# Patient Record
Sex: Male | Born: 1980 | Race: White | Hispanic: No | Marital: Married | Smoking: Never smoker
Health system: Southern US, Community
[De-identification: ages and names within clinical notes are randomized; demographics above are authoritative.]

## PROBLEM LIST (undated history)

## (undated) DIAGNOSIS — F1910 Other psychoactive substance abuse, uncomplicated: Secondary | ICD-10-CM

## (undated) HISTORY — PX: WRIST SURGERY: SHX841

---

## 2005-01-03 ENCOUNTER — Emergency Department (HOSPITAL_COMMUNITY): Admission: EM | Admit: 2005-01-03 | Discharge: 2005-01-04 | Payer: Self-pay | Admitting: Emergency Medicine

## 2014-03-24 ENCOUNTER — Inpatient Hospital Stay (HOSPITAL_COMMUNITY)
Admission: AD | Admit: 2014-03-24 | Discharge: 2014-03-28 | DRG: 885 | Disposition: A | Discharge status: Home / Self Care | Payer: BC Managed Care – PPO | Source: Intra-hospital | Attending: Psychiatry | Admitting: Psychiatry

## 2014-03-24 ENCOUNTER — Encounter (HOSPITAL_COMMUNITY): Payer: Self-pay | Admitting: Emergency Medicine

## 2014-03-24 ENCOUNTER — Emergency Department (HOSPITAL_COMMUNITY)
Admission: EM | Admit: 2014-03-24 | Discharge: 2014-03-24 | Disposition: A | Discharge status: Other Acute Inpatient Hospital | Payer: BC Managed Care – PPO | Attending: Emergency Medicine | Admitting: Emergency Medicine

## 2014-03-24 ENCOUNTER — Encounter (HOSPITAL_COMMUNITY): Payer: Self-pay

## 2014-03-24 DIAGNOSIS — F332 Major depressive disorder, recurrent severe without psychotic features: Principal | ICD-10-CM | POA: Diagnosis present

## 2014-03-24 DIAGNOSIS — R45851 Suicidal ideations: Secondary | ICD-10-CM | POA: Insufficient documentation

## 2014-03-24 DIAGNOSIS — F1020 Alcohol dependence, uncomplicated: Secondary | ICD-10-CM | POA: Diagnosis present

## 2014-03-24 DIAGNOSIS — F411 Generalized anxiety disorder: Secondary | ICD-10-CM | POA: Diagnosis present

## 2014-03-24 DIAGNOSIS — F329 Major depressive disorder, single episode, unspecified: Secondary | ICD-10-CM | POA: Diagnosis present

## 2014-03-24 DIAGNOSIS — IMO0002 Reserved for concepts with insufficient information to code with codable children: Secondary | ICD-10-CM | POA: Diagnosis not present

## 2014-03-24 DIAGNOSIS — G4700 Insomnia, unspecified: Secondary | ICD-10-CM | POA: Diagnosis present

## 2014-03-24 DIAGNOSIS — Z008 Encounter for other general examination: Secondary | ICD-10-CM | POA: Insufficient documentation

## 2014-03-24 HISTORY — DX: Other psychoactive substance abuse, uncomplicated: F19.10

## 2014-03-24 LAB — RAPID URINE DRUG SCREEN, HOSP PERFORMED
AMPHETAMINES: NOT DETECTED
BENZODIAZEPINES: NOT DETECTED
Barbiturates: NOT DETECTED
Cocaine: NOT DETECTED
OPIATES: NOT DETECTED
TETRAHYDROCANNABINOL: NOT DETECTED

## 2014-03-24 LAB — COMPREHENSIVE METABOLIC PANEL
ALT: 15 U/L (ref 0–53)
ANION GAP: 14 (ref 5–15)
AST: 28 U/L (ref 0–37)
Albumin: 4.8 g/dL (ref 3.5–5.2)
Alkaline Phosphatase: 44 U/L (ref 39–117)
BUN: 15 mg/dL (ref 6–23)
CALCIUM: 9.6 mg/dL (ref 8.4–10.5)
CO2: 28 meq/L (ref 19–32)
Chloride: 99 mEq/L (ref 96–112)
Creatinine, Ser: 0.83 mg/dL (ref 0.50–1.35)
Glucose, Bld: 104 mg/dL — ABNORMAL HIGH (ref 70–99)
POTASSIUM: 3.9 meq/L (ref 3.7–5.3)
SODIUM: 141 meq/L (ref 137–147)
TOTAL PROTEIN: 8.4 g/dL — AB (ref 6.0–8.3)
Total Bilirubin: 0.8 mg/dL (ref 0.3–1.2)

## 2014-03-24 LAB — CBC
HEMATOCRIT: 45.1 % (ref 39.0–52.0)
Hemoglobin: 15.1 g/dL (ref 13.0–17.0)
MCH: 32.9 pg (ref 26.0–34.0)
MCHC: 33.5 g/dL (ref 30.0–36.0)
MCV: 98.3 fL (ref 78.0–100.0)
Platelets: 223 10*3/uL (ref 150–400)
RBC: 4.59 MIL/uL (ref 4.22–5.81)
RDW: 13.8 % (ref 11.5–15.5)
WBC: 6 10*3/uL (ref 4.0–10.5)

## 2014-03-24 LAB — ACETAMINOPHEN LEVEL: Acetaminophen (Tylenol), Serum: <15 ug/mL (ref 10–30)

## 2014-03-24 LAB — ETHANOL

## 2014-03-24 LAB — SALICYLATE LEVEL

## 2014-03-24 MED ORDER — SERTRALINE HCL 50 MG PO TABS
25.0000 mg | ORAL_TABLET | Freq: Every day | ORAL | Status: DC
  Administered 2014-03-24: 25 mg via ORAL
  Filled 2014-03-24: qty 1

## 2014-03-24 MED ORDER — ALUM & MAG HYDROXIDE-SIMETH 200-200-20 MG/5ML PO SUSP: 30.0000 mL | ORAL | Status: DC | PRN

## 2014-03-24 MED ORDER — MAGNESIUM HYDROXIDE 400 MG/5ML PO SUSP: 30.0000 mL | Freq: Every day | ORAL | Status: DC | PRN

## 2014-03-24 MED ORDER — HYDROXYZINE HCL 25 MG PO TABS: 25.0000 mg | ORAL_TABLET | Freq: Four times a day (QID) | ORAL | Status: DC | PRN

## 2014-03-24 MED ORDER — ACETAMINOPHEN 325 MG PO TABS: 650.0000 mg | ORAL_TABLET | Freq: Four times a day (QID) | ORAL | Status: DC | PRN

## 2014-03-24 MED ORDER — HYDROXYZINE HCL 25 MG PO TABS
50.0000 mg | ORAL_TABLET | Freq: Every evening | ORAL | Status: DC | PRN
Start: 2014-03-24 — End: 2014-03-24

## 2014-03-24 MED ORDER — ONDANSETRON HCL 4 MG PO TABS: 4.0000 mg | ORAL_TABLET | Freq: Three times a day (TID) | ORAL | Status: DC | PRN

## 2014-03-24 MED ORDER — TRAZODONE HCL 50 MG PO TABS
50.0000 mg | ORAL_TABLET | Freq: Every evening | ORAL | Status: DC | PRN
  Filled 2014-03-24 (×2): qty 1
  Filled 2014-03-24: qty 8
  Filled 2014-03-24 (×3): qty 1
  Filled 2014-03-24 (×2): qty 8
  Filled 2014-03-24 (×3): qty 1
  Filled 2014-03-24 (×2): qty 8
  Filled 2014-03-24 (×2): qty 1

## 2014-03-24 MED ORDER — LORAZEPAM 1 MG PO TABS: 1.0000 mg | ORAL_TABLET | Freq: Three times a day (TID) | ORAL | Status: DC | PRN

## 2014-03-24 NOTE — Progress Notes (Signed)
Per Minerva Areola [facility] patient is accepted to [facility] bed 508-1 after 7:30pm.     Maryelizabeth Rowan, MSW, [place], 03/24/2014 Evening Clinical Social Worker [phone]

## 2014-03-24 NOTE — ED Notes (Signed)
Pt. To [facility] from ED ambulatory without difficulty. Report from [employer]. Pt. Is alert and oriented, warm and dry in no distress. Pt. Denies HI, and AVH. Pt. Calm and cooperative. Pt. Encouraged to let Nursing staff know if he has concerns or needs.

## 2014-03-24 NOTE — Progress Notes (Signed)
Admission note: Patient admitted from [facility]. Patient recently adopted a 43 old baby boy from [place] and the patient is depressed because he does not feel like he is bonding with the baby and he has only been sleeping four to five hours a night. The patient is having suicidal thoughts but no plan at present and does contract for safety. Patient denies HI, AVH and substance abuse. The patient does admit to drinking 2-4 beers daily and takes Naltrexone. The patient implied that taking the Naltrexone was part of some sort of method to avoid drinking too much. Patient is pleasant and cooperative. Mood is depressed. Affect is sad. Will continue to monitor for safety. [name], RN

## 2014-03-24 NOTE — ED Provider Notes (Signed)
CSN: [id number]     Arrival date & time 03/24/14  1132 History  This chart was scribed for a non-physician practitioner, Roxy Horseman, PA-C, working with Linwood Dibbles, MD by Julian Hy, ED Scribe. The patient was seen in WTR4/WLPT4. The patient's care was started at 12:27 PM.    Chief Complaint  Patient presents with  . Suicidal  . Medical Clearance   The history is provided by the patient. No language interpreter was used.   HPI Comments: Vincent Townsend is a 33 y.o. male who presents to the Emergency Department complaining of recurrent, intermittent, gradually worsening suicidal ideation onset three days ago. Pt reports he has had issues with SI for the past 3 years. Pt reports he had a plan 1) borrow a gun from his father or 2) sleeping pills and whiskey. Pt denies ever acting on his ideas. Pt denies HI. Pt reports he drank last night. Pt denies illicit drug usage. Pt denies chest pain, nausea, vomiting, SOB, abdominal pain and headache.  Past Medical History  Diagnosis Date  . Substance abuse    Past Surgical History  Procedure Laterality Date  . Wrist surgery     No family history on file. History  Substance Use Topics  . Smoking status: Never Smoker   . Smokeless tobacco: Not on file  . Alcohol Use: No    Review of Systems  Constitutional: Negative for fever.  Cardiovascular: Negative for chest pain.  Gastrointestinal: Negative for nausea, vomiting and abdominal pain.  Musculoskeletal: Negative for arthralgias and back pain.  Neurological: Negative for headaches.  Psychiatric/Behavioral: Positive for suicidal ideas. Negative for self-injury.   Allergies  Review of patient's allergies indicates no known allergies.  Home Medications   Prior to Admission medications   Medication Sig Start Date End Date Taking? Authorizing Provider  Melatonin 5 MG TABS Take 1 tablet by mouth at bedtime.   Yes Historical Provider, MD  naltrexone (DEPADE) 50 MG tablet Take 50 mg by mouth  daily as needed (for drinking.).   Yes Historical Provider, MD   Triage Vitals: BP 141/82  Pulse 67  Temp(Src) 98.5 F (36.9 C) (Oral)  Resp 16  SpO2 99% Physical Exam  Nursing note and vitals reviewed. Constitutional: He is oriented to person, place, and time. He appears well-developed and well-nourished. No distress.  HENT:  Head: Normocephalic and atraumatic.  Eyes: Conjunctivae and EOM are normal. Pupils are equal, round, and reactive to light. Right eye exhibits no discharge. Left eye exhibits no discharge. No scleral icterus.  Neck: Normal range of motion. Neck supple. No JVD present. No tracheal deviation present.  Cardiovascular: Normal rate, regular rhythm and normal heart sounds.  Exam reveals no gallop and no friction rub.   No murmur heard. Pulmonary/Chest: Effort normal and breath sounds normal. No respiratory distress. He has no wheezes. He has no rales. He exhibits no tenderness.  Abdominal: Soft. Bowel sounds are normal. He exhibits no distension and no mass. There is no tenderness. There is no rebound and no guarding.  Musculoskeletal: Normal range of motion. He exhibits no edema and no tenderness.  Neurological: He is alert and oriented to person, place, and time.  Skin: Skin is warm and dry.  Psychiatric: He has a normal mood and affect. His behavior is normal. Judgment and thought content normal.    ED Course  Procedures (including critical care time) DIAGNOSTIC STUDIES: Oxygen Saturation is 99% on RA, normal by my interpretation.    COORDINATION OF CARE: 12:31 PM-  Patient informed of current plan for treatment and evaluation and agrees with plan at this time.  Results for orders placed during the hospital encounter of 03/24/14  ACETAMINOPHEN LEVEL      Result Value Ref Range   Acetaminophen (Tylenol), Serum <15.0  10 - 30 ug/mL  CBC      Result Value Ref Range   WBC 6.0  4.0 - 10.5 K/uL   RBC 4.59  4.22 - 5.81 MIL/uL   Hemoglobin 15.1  13.0 - 17.0 g/dL    HCT 46.9  62.9 - 52.8 %   MCV 98.3  78.0 - 100.0 fL   MCH 32.9  26.0 - 34.0 pg   MCHC 33.5  30.0 - 36.0 g/dL   RDW 41.3  24.4 - 01.0 %   Platelets 223  150 - 400 K/uL  COMPREHENSIVE METABOLIC PANEL      Result Value Ref Range   Sodium 141  137 - 147 mEq/L   Potassium 3.9  3.7 - 5.3 mEq/L   Chloride 99  96 - 112 mEq/L   CO2 28  19 - 32 mEq/L   Glucose, Bld 104 (*) 70 - 99 mg/dL   BUN 15  6 - 23 mg/dL   Creatinine, Ser 2.72  0.50 - 1.35 mg/dL   Calcium 9.6  8.4 - 53.6 mg/dL   Total Protein 8.4 (*) 6.0 - 8.3 g/dL   Albumin 4.8  3.5 - 5.2 g/dL   AST 28  0 - 37 U/L   ALT 15  0 - 53 U/L   Alkaline Phosphatase 44  39 - 117 U/L   Total Bilirubin 0.8  0.3 - 1.2 mg/dL   GFR calc non Af Amer >90  >90 mL/min   GFR calc Af Amer >90  >90 mL/min   Anion gap 14  5 - 15  ETHANOL      Result Value Ref Range   Alcohol, Ethyl (B) <11  0 - 11 mg/dL  SALICYLATE LEVEL      Result Value Ref Range   Salicylate Lvl <2.0 (*) 2.8 - 20.0 mg/dL  URINE RAPID DRUG SCREEN (HOSP PERFORMED)      Result Value Ref Range   Opiates NONE DETECTED  NONE DETECTED   Cocaine NONE DETECTED  NONE DETECTED   Benzodiazepines NONE DETECTED  NONE DETECTED   Amphetamines NONE DETECTED  NONE DETECTED   Tetrahydrocannabinol NONE DETECTED  NONE DETECTED   Barbiturates NONE DETECTED  NONE DETECTED   No results found.   MDM   Final diagnoses:  Major depressive disorder, recurrent, severe without psychotic features    Patient with SI.  Wishes to shoot himself or overdose.  TTS consult pending.  I personally performed the services described in this documentation, which was scribed in my presence. The recorded information has been reviewed and is accurate.     Roxy Horseman, PA-C 03/24/14 1829

## 2014-03-24 NOTE — ED Notes (Signed)
Pt transported to [facility] by [facility] transportation service for continuation of specialized care. He left in no acute distress. 

## 2014-03-24 NOTE — Consult Note (Signed)
[facility] Face-to-Face Psychiatry Consult   Reason for Consult:  Depression with suicidal ideations Referring Physician:  EDP  Vincent Townsend is an 33 y.o. male. Total Time spent with patient: 20 minutes  Assessment: AXIS I:  Major Depression, Recurrent severe AXIS II:  Deferred AXIS III:   Past Medical History  Diagnosis Date  . Substance abuse    AXIS IV:  other psychosocial or environmental problems, problems related to social environment and problems with primary support group AXIS V:  21-30 behavior considerably influenced by delusions or hallucinations OR serious impairment in judgment, communication OR inability to function in almost all areas  Plan:  Recommend psychiatric Inpatient admission when medically cleared.   Subjective:   Vincent Townsend is a 33 y.o. male patient admitted with   HPI:  On admission:  Vincent Townsend is a 33 y.o. male who presents to the Emergency Department complaining of recurrent, intermittent, gradually worsening suicidal ideation onset three days ago. Pt reports he has had issues with SI for the past 3 years. Pt reports he had a plan 1) borrow a gun from his father or 2) sleeping pills and whiskey. Pt denies ever acting on his ideas. Pt denies HI. Pt reports he drank last night. Pt denies illicit drug usage. Pt denies chest pain, nausea, vomiting, SOB, abdominal pain and headache. On assessment:  Patient is tearful with a supportive, tearful wife at his bedside.  They recently went to [place] for 38 days to get their adopted baby boy.  He is now 2 months old and active but no apparent special needs.   Patient does not feel he has bonded with the baby.  Both parents have had lack of sleep and appear to be tired and exhausted.  [name] has been getting 4-5 hours of sleep with a decrease in appetite.  He has been depressed before but never on medications; saw a counselor Dr. Bubba Hales in the past and would like him involved in his care.  Takes Naltrexone preventively due to  alcoholism in his family, never been in detox or rehab for alcohol abuse, Vernona Rieger method to stay clear of alcohol abuse.  Denies homicidal ideations, hallucinations, and drug abuse.  He is unable to contract for safety and needs stabilization.  HPI Elements:   Location:  generalized. Quality:  acute. Severity:  severe. Timing:  constant. Duration:  3 days. Context:  stressors.  Past Psychiatric History: Past Medical History  Diagnosis Date  . Substance abuse     reports that he has never smoked. He does not have any smokeless tobacco history on file. He reports that he does not drink alcohol or use illicit drugs. No family history on file.         Allergies:  No Known Allergies  ACT Assessment Complete:  Yes:    Educational Status    Risk to Self: Risk to self with the past 6 months Is patient at risk for suicide?: Yes Substance abuse history and/or treatment for substance abuse?: No  Risk to Others:    Abuse:    Prior Inpatient Therapy:    Prior Outpatient Therapy:    Additional Information:                    Objective: Blood pressure 141/82, pulse 67, temperature 98.5 F (36.9 C), temperature source Oral, resp. rate 16, SpO2 99.00%.There is no height or weight on file to calculate BMI. Results for orders placed during the hospital encounter of 03/24/14 (from the past  72 hour(s))  ACETAMINOPHEN LEVEL     Status: None   Collection Time    03/24/14 11:55 AM      Result Value Ref Range   Acetaminophen (Tylenol), Serum <15.0  10 - 30 ug/mL   Comment:            THERAPEUTIC CONCENTRATIONS VARY     SIGNIFICANTLY. A RANGE OF 10-30     ug/mL MAY BE AN EFFECTIVE     CONCENTRATION FOR MANY PATIENTS.     HOWEVER, SOME ARE BEST TREATED     AT CONCENTRATIONS OUTSIDE THIS     RANGE.     ACETAMINOPHEN CONCENTRATIONS     >150 ug/mL AT 4 HOURS AFTER     INGESTION AND >50 ug/mL AT 12     HOURS AFTER INGESTION ARE     OFTEN ASSOCIATED WITH TOXIC     REACTIONS.   CBC     Status: None   Collection Time    03/24/14 11:55 AM      Result Value Ref Range   WBC 6.0  4.0 - 10.5 K/uL   RBC 4.59  4.22 - 5.81 MIL/uL   Hemoglobin 15.1  13.0 - 17.0 g/dL   HCT 45.1  39.0 - 52.0 %   MCV 98.3  78.0 - 100.0 fL   MCH 32.9  26.0 - 34.0 pg   MCHC 33.5  30.0 - 36.0 g/dL   RDW 13.8  11.5 - 15.5 %   Platelets 223  150 - 400 K/uL  COMPREHENSIVE METABOLIC PANEL     Status: Abnormal   Collection Time    03/24/14 11:55 AM      Result Value Ref Range   Sodium 141  137 - 147 mEq/L   Potassium 3.9  3.7 - 5.3 mEq/L   Chloride 99  96 - 112 mEq/L   CO2 28  19 - 32 mEq/L   Glucose, Bld 104 (*) 70 - 99 mg/dL   BUN 15  6 - 23 mg/dL   Creatinine, Ser 0.83  0.50 - 1.35 mg/dL   Calcium 9.6  8.4 - 10.5 mg/dL   Total Protein 8.4 (*) 6.0 - 8.3 g/dL   Albumin 4.8  3.5 - 5.2 g/dL   AST 28  0 - 37 U/L   ALT 15  0 - 53 U/L   Alkaline Phosphatase 44  39 - 117 U/L   Total Bilirubin 0.8  0.3 - 1.2 mg/dL   GFR calc non Af Amer >90  >90 mL/min   GFR calc Af Amer >90  >90 mL/min   Comment: (NOTE)     The eGFR has been calculated using the CKD EPI equation.     This calculation has not been validated in all clinical situations.     eGFR's persistently <90 mL/min signify possible Chronic Kidney     Disease.   Anion gap 14  5 - 15  ETHANOL     Status: None   Collection Time    03/24/14 11:55 AM      Result Value Ref Range   Alcohol, Ethyl (B) <11  0 - 11 mg/dL   Comment:            LOWEST DETECTABLE LIMIT FOR     SERUM ALCOHOL IS 11 mg/dL     FOR MEDICAL PURPOSES ONLY  SALICYLATE LEVEL     Status: Abnormal   Collection Time    03/24/14 11:55 AM      Result Value  Ref Range   Salicylate Lvl <0.9 (*) 2.8 - 20.0 mg/dL  URINE RAPID DRUG SCREEN (HOSP PERFORMED)     Status: None   Collection Time    03/24/14 11:57 AM      Result Value Ref Range   Opiates NONE DETECTED  NONE DETECTED   Cocaine NONE DETECTED  NONE DETECTED   Benzodiazepines NONE DETECTED  NONE DETECTED    Amphetamines NONE DETECTED  NONE DETECTED   Tetrahydrocannabinol NONE DETECTED  NONE DETECTED   Barbiturates NONE DETECTED  NONE DETECTED   Comment:            DRUG SCREEN FOR MEDICAL PURPOSES     ONLY.  IF CONFIRMATION IS NEEDED     FOR ANY PURPOSE, NOTIFY LAB     WITHIN 5 DAYS.                LOWEST DETECTABLE LIMITS     FOR URINE DRUG SCREEN     Drug Class       Cutoff (ng/mL)     Amphetamine      1000     Barbiturate      200     Benzodiazepine   323     Tricyclics       557     Opiates          300     Cocaine          300     THC              50   Labs are reviewed and are pertinent for no medical issues noted.  Current Facility-Administered Medications  Medication Dose Route Frequency Provider Last Rate Last Dose  . LORazepam (ATIVAN) tablet 1 mg  1 mg Oral Q8H PRN Montine Circle, PA-C      . ondansetron [facility]) tablet 4 mg  4 mg Oral Q8H PRN Montine Circle, PA-C       Current Outpatient Prescriptions  Medication Sig Dispense Refill  . Melatonin 5 MG TABS Take 1 tablet by mouth at bedtime.      . naltrexone (DEPADE) 50 MG tablet Take 50 mg by mouth daily as needed (for drinking.).        Psychiatric Specialty Exam:     Blood pressure 141/82, pulse 67, temperature 98.5 F (36.9 C), temperature source Oral, resp. rate 16, SpO2 99.00%.There is no height or weight on file to calculate BMI.  General Appearance: Casual  Eye Contact::  Fair  Speech:  Normal Rate  Volume:  Normal  Mood:  Depressed  Affect:  Depressed  Thought Process:  Coherent  Orientation:  Full (Time, Place, and Person)  Thought Content:  WDL  Suicidal Thoughts:  Yes.  with intent/plan  Homicidal Thoughts:  No  Memory:  Immediate;   Fair Recent;   Fair Remote;   Fair  Judgement:  Poor  Insight:  Fair  Psychomotor Activity:  Decreased  Concentration:  Fair  Recall:  [employer] of Knowledge:Good  Language: Good  Akathisia:  No  Handed:  Right  AIMS (if indicated):     Assets:   Communication Skills Desire for Improvement Financial Resources/Insurance Housing Intimacy Leisure Time [place] Talents/Skills Transportation Vocational/Educational  Sleep:      Musculoskeletal: Strength & Muscle Tone: within normal limits Gait & Station: normal Patient leans: N/A  Treatment Plan Summary: Daily contact with patient to assess and evaluate symptoms and progress in treatment Medication  management; Zoloft started for depression, admit to inpatient psychiatry for stabilization  Waylan Boga, PMH-NP 03/24/2014 2:14 PM  I agreed with the findings, treatment and disposition plan of this patient. Berniece Andreas, MD

## 2014-03-24 NOTE — Tx Team (Signed)
Initial Interdisciplinary Treatment Plan   PATIENT STRESSORS: recently adopted a 26 old baby from [place]   PROBLEM LIST: Problem List/Patient Goals Date to be addressed Date deferred Reason deferred Estimated date of resolution  depression 03/24/14     Suicidal ideation 03/24/14                                                DISCHARGE CRITERIA:  Ability to meet basic life and health needs Adequate post-discharge living arrangements Improved stabilization in mood, thinking, and/or behavior Medical problems require only outpatient monitoring Motivation to continue treatment in a less acute level of care Need for constant or close observation no longer present Reduction of life-threatening or endangering symptoms to within safe limits Safe-care adequate arrangements made Verbal commitment to aftercare and medication compliance  PRELIMINARY DISCHARGE PLAN: Outpatient therapy Return to previous living arrangement Return to previous work or school arrangements  PATIENT/FAMIILY INVOLVEMENT: This treatment plan has been presented to and reviewed with the patient, Vincent Townsend, and/or family member.  The patient and family have been given the opportunity to ask questions and make suggestions.  [name] 03/24/2014, 10:43 PM

## 2014-03-24 NOTE — ED Notes (Signed)
Patient has been wanded by security.  Patient has on burgundy scrubs and yellow socks.  Patient has one personal belonging bag.

## 2014-03-24 NOTE — ED Notes (Signed)
Britta Mccreedy, RN at [facility] reported KIM, [facility] stated to hold patient for 1.5 hours.

## 2014-03-24 NOTE — ED Notes (Signed)
Pts. Therapist is Homero Fellers in [place].

## 2014-03-24 NOTE — ED Notes (Addendum)
Pt c/o SI w/ plan. States he has couple of options, gun from dad or pills and liquor. No attempts in past.

## 2014-03-25 DIAGNOSIS — F339 Major depressive disorder, recurrent, unspecified: Secondary | ICD-10-CM

## 2014-03-25 DIAGNOSIS — R45851 Suicidal ideations: Secondary | ICD-10-CM

## 2014-03-25 MED ORDER — NALTREXONE HCL 50 MG PO TABS
50.0000 mg | ORAL_TABLET | Freq: Every day | ORAL | Status: DC | PRN
  Filled 2014-03-25: qty 4

## 2014-03-25 MED ORDER — MELATONIN 5 MG PO TABS
1.0000 | ORAL_TABLET | Freq: Every day | ORAL | Status: DC
  Administered 2014-03-25 – 2014-03-27 (×3): 5 mg via ORAL

## 2014-03-25 MED ORDER — BUPROPION HCL ER (XL) 150 MG PO TB24
150.0000 mg | ORAL_TABLET | Freq: Every day | ORAL | Status: DC
  Administered 2014-03-25 – 2014-03-27 (×3): 150 mg via ORAL
  Filled 2014-03-25 (×6): qty 1

## 2014-03-25 NOTE — Progress Notes (Signed)
Patient ID: Vincent Townsend, male   DOB: 01-17-1981, 33 y.o.   MRN: [id number] He has been up and two groups interacting with peers and staff. Self inventory this AM: depression 3, hopelessness 1, anxiety 0, denies SI thoughts.His goal is to work on his discharge plans  And to talk to his wife. Wellbutrin started today.

## 2014-03-25 NOTE — [facility] Suicide Risk Assessment (Signed)
   Nursing information obtained from:    Demographic factors:   33 year old man, married , employed Current Mental Status:   See below Loss Factors:   depression, difficulty relating to recent adoption of child Historical Factors:   History of Depression Risk Reduction Factors:   supportive family, employed Total Time spent with patient: 45 minutes  CLINICAL FACTORS:   Depression:   Comorbid alcohol abuse/dependence  Psychiatric Specialty Exam: Physical Exam  ROS  Blood pressure 128/68, pulse 64, temperature 98.6 F (37 C), temperature source Oral, resp. rate 18, height  (1.727 m), weight 67.132 kg (148 lb).Body mass index is 22.51 kg/(m^2).  General Appearance: Well Groomed  Patent attorney::  Good  Speech:  Normal Rate  Volume:  Normal  Mood:  Depressed  Affect:  Constricted and but reactive  Thought Process:  Intact and Linear  Orientation:  Other:  fully alert and attentive  Thought Content:  no hallucinations, no delusions  Suicidal Thoughts:  No- at this time he denies any thoughts of hurting self or anyone else- he contracts for safety on the unit  Homicidal Thoughts:  No  Memory:  NA  Judgement:  Fair  Insight:  Fair  Psychomotor Activity:  Normal  Concentration:  Good  Recall:  Good  Fund of Knowledge:Good  Language: Good  Akathisia:  Negative  Handed:  Right  AIMS (if indicated):     Assets:  Communication Skills Desire for Improvement Resilience Social Support Talents/Skills  Sleep:  Number of Hours: 5.75   Musculoskeletal: Strength & Muscle Tone: within normal limits Gait & Station: normal Patient leans: N/A  COGNITIVE FEATURES THAT CONTRIBUTE TO RISK:  Polarized thinking    SUICIDE RISK:   Moderate:  Frequent suicidal ideation with limited intensity, and duration, some specificity in terms of plans, no associated intent, good self-control, limited dysphoria/symptomatology, some risk factors present, and identifiable protective factors, including  available and accessible social support.  PLAN OF CARE: Patient will be admitted to inpatient psychiatric unit for stabilization and safety. Will provide and encourage milieu participation. Provide medication management and maked adjustments as needed.  Will follow daily.    I certify that inpatient services furnished can reasonably be expected to improve the patient's condition.  Waymond Meador 03/25/2014, 2:06 PM

## 2014-03-25 NOTE — [facility] Suicide Risk Assessment (Signed)
[facility] INPATIENT:  Family/Significant Other Suicide Prevention Education  Suicide Prevention Education:  Education Completed; Wife Goldie Dimmer [phone],  (name of family member/significant other) has been identified by the patient as the family member/significant other with whom the patient will be residing, and identified as the person(s) who will aid the patient in the event of a mental health crisis (suicidal ideations/suicide attempt).  With written consent from the patient, the family member/significant other has been provided the following suicide prevention education, prior to the and/or following the discharge of the patient.  The suicide prevention education provided includes the following:  Suicide risk factors  Suicide prevention and interventions  National Suicide Hotline telephone number  [facility] assessment telephone number  [facility] Emergency Assistance 911  [facility] and/or Residential Mobile Crisis Unit telephone number  Request made of family/significant other to:  Remove weapons (e.g., guns, rifles, knives), all items previously/currently identified as safety concern.    Remove drugs/medications (over-the-counter, prescriptions, illicit drugs), all items previously/currently identified as a safety concern.  The family member/significant other verbalizes understanding of the suicide prevention education information provided.  The family member/significant other agrees to remove the items of safety concern listed above.  Vincent Townsend, [place] 03/25/2014, 12:41 PM

## 2014-03-25 NOTE — Progress Notes (Signed)
The focus of this group is to educate the patient on the purpose and policies of crisis stabilization and provide a format to answer questions about their admission.  The group details unit policies and expectations of patients while admitted.  Patient did not attend 0900 nurse education orientation group this morning.  Patient was in conference with MD.  

## 2014-03-25 NOTE — [facility] Group Notes (Signed)
[facility] LCSW Group Therapy  03/25/2014   1:15 PM   Type of Therapy:  Group Therapy  Participation Level:  Active  Participation Quality:  Attentive, Sharing and Supportive  Affect:  Depressed and Flat  Cognitive:  Alert and Oriented  Insight:  Developing/Improving and Engaged  Engagement in Therapy:  Developing/Improving and Engaged  Modes of Intervention:  Clarification, Confrontation, Discussion, Education, Exploration, Limit-setting, Orientation, Problem-solving, Rapport Building, Dance movement psychotherapist, Socialization and Support  Summary of Progress/Problems: The topic for group therapy was feelings about diagnosis.  Pt actively participated in group discussion on their past and current diagnosis and how they feel towards this.  Pt also identified how society and family members judge them, based on their diagnosis as well as stereotypes and stigmas.  Patient reports that he is experiencing depression and experiences shame regarding people knowing that he experienced SI, specifically his extended family. CSW and other group members provided emotional support and encouragement.   Samuella Bruin, MSW, [employer] Clinical Social Worker [facility] [phone]

## 2014-03-25 NOTE — Clinical Social Work Note (Signed)
CSW left voicemail for patient's therapist Curlene Dolphin [phone] to schedule upcoming appointment. Awaiting call back.   Samuella Bruin, MSW, [employer] Clinical Social Worker [facility] [phone]

## 2014-03-25 NOTE — Progress Notes (Signed)
Recreation Therapy Notes  Animal-Assisted Activity/Therapy (AAA/T) Program Checklist/Progress Notes Patient Eligibility Criteria Checklist & Daily Group note for Rec Tx Intervention  Date: 08.25.2015 Time: 2:45pm Location: [address]   AAA/T Program Assumption of Risk Form signed by Patient/ or Parent Legal Guardian yes  Patient is free of allergies or sever asthma yes  Patient reports no fear of animals yes  Patient reports no history of cruelty to animals yes   Patient understands his/her participation is voluntary yes  Patient washes hands before animal contact yes  Patient washes hands after animal contact yes  Behavioral Response: Appropriate   Education: Hand Washing, Appropriate Animal Interaction   Education Outcome: Acknowledges understanding   Clinical Observations/Feedback: Patient attended and participated appropriately with therapeutic dog team.   Jearl Klinefelter, LRT/CTRS  Delanie Tirrell L 03/25/2014 4:09 PM
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
## 2014-03-25 NOTE — Progress Notes (Signed)
Adult Psychoeducational Group Note  Date:  03/25/2014 Time:  10:17 PM  Group Topic/Focus:  Wrap-Up Group:   The focus of this group is to help patients review their daily goal of treatment and discuss progress on daily workbooks.  Participation Level:  Active  Participation Quality:  Appropriate  Affect:  Appropriate  Cognitive:  Appropriate  Insight: Appropriate  Engagement in Group:  Engaged  Modes of Intervention:  Activity  Additional Comments:  Pt was in group and participated in group while group was going on..the patient talk to other with respect   Marlette Curvin R 03/25/2014, 10:17 PM

## 2014-03-25 NOTE — Tx Team (Signed)
Initial Interdisciplinary Treatment Plan   PATIENT STRESSORS: Health problems Substance abuse   PROBLEM LIST: Problem List/Patient Goals Date to be addressed Date deferred Reason deferred Estimated date of resolution  'substance abuse"      ' insomnia "                                                 DISCHARGE CRITERIA:  Ability to meet basic life and health needs Improved stabilization in mood, thinking, and/or behavior Safe-care adequate arrangements made Verbal commitment to aftercare and medication compliance Withdrawal symptoms are absent or subacute and managed without 24-hour nursing intervention  PRELIMINARY DISCHARGE PLAN: Attend 12-step recovery group Return to previous living arrangement Return to previous work or school arrangements  PATIENT/FAMIILY INVOLVEMENT: This treatment plan has been presented to and reviewed with the patient, Vincent Townsend.  Patient is assertive and cooperative and plans to work on discharge plan and after care . The patient and family have been given the opportunity to ask questions and make suggestions.    Oliva Bustard 03/25/2014, 4:00 PM

## 2014-03-26 DIAGNOSIS — F329 Major depressive disorder, single episode, unspecified: Secondary | ICD-10-CM

## 2014-03-26 NOTE — ED Provider Notes (Signed)
Medical screening examination/treatment/procedure(s) were performed by non-physician practitioner and as supervising physician I was immediately available for consultation/collaboration.    Nicholaos Schippers, MD 03/26/14 0724 

## 2014-03-26 NOTE — Progress Notes (Signed)
D   Pt is pleasant on approach and appropriate in his interactions with others   He appears to have some anxiety and interacts minimally with others    He stayed in his room writing in his journal   He took his melantonin but did not want the trazadone   He reports no problems with the new antidepressant Wellbutrin that he started today   He reports some improvement in mood related to getting some rest and said he is looking forward to being discharged A   Pt was encouraged to take medications after eating and not on an empty stomach   Verbal support given   Medications administered and effectiveness monitored   Q 15 min checks R   Pt safe at present

## 2014-03-26 NOTE — Progress Notes (Signed)
Adult Psychoeducational Group Note  Date:  03/26/2014 Time:  10:54 PM  Group Topic/Focus:  Goals Group:   The focus of this group is to help patients establish daily goals to achieve during treatment and discuss how the patient can incorporate goal setting into their daily lives to aide in recovery.  Participation Level:  Active  Participation Quality:  Appropriate  Affect:  Appropriate  Cognitive:  Appropriate  Insight: Appropriate  Engagement in Group:  Engaged  Modes of Intervention:  Discussion  Additional Comments:  Pt stated that the visit from his wife today made his day so much better.   Aldona Lento 03/26/2014, 10:54 PM

## 2014-03-26 NOTE — Progress Notes (Signed)
Patient ID: Vincent Townsend, male   DOB: [birth year], 33 y.o.   MRN: [id number] He has been up and to a couple of groups has little interaction with peers and staff. Has been in room reading . He has had no c/o discomfort. Self inventory this AM:  All the question were marked 0's.

## 2014-03-26 NOTE — [facility] Group Notes (Signed)
[facility] LCSW Group Therapy 03/26/2014  1:15 PM Type of Therapy: Group Therapy Participation Level: Active  Participation Quality: Attentive, Sharing and Supportive  Affect: Depressed and Flat  Cognitive: Alert and Oriented  Insight: Developing/Improving and Engaged  Engagement in Therapy: Developing/Improving and Engaged  Modes of Intervention: Clarification, Confrontation, Discussion, Education, Exploration, Limit-setting, Orientation, Problem-solving, Rapport Building, Dance movement psychotherapist, Socialization and Support  Summary of Progress/Problems: The topic for group today was emotional regulation. This group focused on both positive and negative emotion identification and allowed group members to process ways to identify feelings, regulate negative emotions, and find healthy ways to manage internal/external emotions. Group members were asked to reflect on a time when their reaction to an emotion led to a negative outcome and explored how alternative responses using emotion regulation would have benefited them. Group members were also asked to discuss a time when emotion regulation was utilized when a negative emotion was experienced. Patient identified experiencing hopelessness related to the adjustment of adopting his son. He reports that he tries to remind his self that the situation is only temporary. Patient reports that he would like to have more relaxation time for himself as well as a couple with his wife. CSW's and other group members provided patient with emotional support and encouragement.  Samuella Bruin, MSW, [employer] Clinical Social Worker [facility] [phone]

## 2014-03-26 NOTE — Progress Notes (Signed)
Adult Psychoeducational Group Note  Date:  03/26/2014 Time:  11:12 AM  Group Topic/Focus:  Personal Choices and Values:   The focus of this group is to help patients assess and explore the importance of values in their lives, how their values affect their decisions, how they express their values and what opposes their expression.  Participation Level:  Active  Participation Quality:  Appropriate, Attentive and Sharing  Affect:  Appropriate  Cognitive:  Alert, Appropriate and Oriented  Insight: Appropriate and Good  Engagement in Group:  Engaged  Modes of Intervention:  Discussion, Education, Socialization and Support  Additional Comments:  Pt attended and participated in group.  Pt stated that personal development means "learning to find ways to deal with my depression."  Pt also stated "my current situation is not forever"  Berlin Hun 03/26/2014, 11:12 AM

## 2014-03-26 NOTE — [facility] Group Notes (Signed)
   [facility] LCSW Aftercare Discharge Planning Group Note  03/26/2014  8:45 AM   Participation Quality: Alert, Appropriate and Oriented  Mood/Affect: Appropriate  Depression Rating: 0  Anxiety Rating: 0  Thoughts of Suicide: Pt denies SI/HI  Will you contract for safety? Yes  Current AVH: Pt denies  Plan for Discharge/Comments: Pt attended discharge planning group and actively participated in group. CSW provided pt with today's workbook. Patient reports that he feels "great" this afternoon. He reports no anxiety and depression this morning and reports that "time away" has been helpful. Denies SI/HI. He inquired about discharge, CSW informed patient that he will not likely be discharged this afternoon but to discuss his progress with the psychiatrist/extender daily and that discharge will likely be this week. Patient inquired about learning coping skills and stress techniques to use. Plans to follow up with Curlene Dolphin, [facility] for therapy at discharge.   Transportation Means: Pt reports access to transportation  Supports: Wife and friends have been identified as supports.  Samuella Bruin, MSW, [employer] Clinical Social Worker [facility] [phone]
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
## 2014-03-26 NOTE — Progress Notes (Signed)
Patient ID: Vincent Townsend, male   DOB: January 19, 1981, 33 y.o.   MRN: [id number] D  ---  Pt. Denies pain or dis-comfort at this time.  He is app/coop with staff but stays to himself with minimal interaction with peers.  Pt. Sits in dayroom reading a book on child care.  He appears anxious and nervous when talking to staff.  A  --  Support and safety cks and meds as ordered.  R  --  Pt. Remains safe on unit

## 2014-03-27 MED ORDER — BUPROPION HCL ER (XL) 300 MG PO TB24
300.0000 mg | ORAL_TABLET | Freq: Every day | ORAL | Status: DC
  Administered 2014-03-28: 300 mg via ORAL
  Filled 2014-03-27 (×2): qty 4
  Filled 2014-03-27 (×2): qty 1

## 2014-03-27 NOTE — [facility] Group Notes (Signed)
[facility] LCSW Group Therapy 03/27/2014  1:15 pm   Type of Therapy: Group Therapy Participation Level: Active  Participation Quality: Attentive, Sharing and Supportive  Affect: Depressed and Flat  Cognitive: Alert and Oriented  Insight: Developing/Improving and Engaged  Engagement in Therapy: Developing/Improving and Engaged  Modes of Intervention: Clarification, Confrontation, Discussion, Education, Exploration, Limit-setting, Orientation, Problem-solving, Rapport Building, Dance movement psychotherapist, Socialization and Support  Summary of Progress/Problems: The topic for group was balance in life. Today's group focused on defining balance in one's own words, identifying things that can knock one off balance, and exploring healthy ways to maintain balance in life. Group members were asked to provide an example of a time when they felt off balance, describe how they handled that situation,and process healthier ways to regain balance in the future. Group members were asked to share the most important tool for maintaining balance that they learned while at [facility] and how they plan to apply this method after discharge. Patient shared that he would like to be more active with his friends to prevent isolation. CSWs and other group members provided emotional support and encouragement.   Samuella Bruin, MSW, [employer] Clinical Social Worker [facility] [phone]

## 2014-03-27 NOTE — Progress Notes (Signed)
Patient ID: Vincent Townsend, male   DOB: 06-Aug-1980, 33 y.o.   MRN: [id number] [facility] MD Progress Note  03/27/2014 2:27 PM Keenen Roessner  MRN:  [id number] Subjective:  " I'm doing better" Objective: Currently patient describes improvement. Most noticeably, he reports stating he feels more hopeful and optimistic that he will be able to build a good  Emotional bond with his adopted son, as opposed to the sense of hopelessness about this he had been experiencing prior to admission. He states that he has been reading a book for adoptive parents, and feels he is learning more about the process of adjustment and is more " OK with it". With his express consent and in his presence I have spoken with his wife via phone. She has visited him daily, and states " he seems better".  Patient is becoming more future oriented, and today spoke about returning to work soon and about  His hobby rebuilding an old classic car in his garage.  No disruptive behaviors on unit. Tolerating Wellbutrin XL well thus far.  Diagnosis:  MDD versus Adjustment Disorder with Depressed Mood   Total Time spent with patient: 25 minutes     ADL's: good   Sleep: good   Appetite: good   Suicidal Ideation:  At this time no suicidal ideations Homicidal Ideation:  Denies  AEB (as evidenced by):  Psychiatric Specialty Exam: Physical Exam  Review of Systems  Constitutional: Negative for fever and chills.  Respiratory: Negative for cough and shortness of breath.   Cardiovascular: Negative for chest pain.  Psychiatric/Behavioral: Positive for depression.    Blood pressure 122/75, pulse 79, temperature 98.5 F (36.9 C), temperature source Oral, resp. rate 16, height  (1.727 m), weight 67.132 kg (148 lb).Body mass index is 22.51 kg/(m^2).  General Appearance: Well Groomed  Patent attorney::  Good  Speech:  Normal Rate  Volume:  Normal  Mood:  depression is improving  Affect:  less constricted  Thought Process:  Goal Directed and  Linear  Orientation:  Full (Time, Place, and Person)  Thought Content:  Rumination- less ruminative, and expressing less pessimism.   Suicidal Thoughts:  No- at this time denies any suicidal or homicidal ideations. It should be noted he specifically denies any homicidal or violent ideations towards his adoptive child  Homicidal Thoughts:  No  Memory:  NA  Judgement:  Good  Insight:  Fair  Psychomotor Activity:  Normal  Concentration:  Good  Recall:  Good  Fund of Knowledge:Good  Language: Good  Akathisia:  No  Handed:  Right  AIMS (if indicated):     Assets:  Communication Skills Desire for Improvement Social Support Talents/Skills  Sleep:  Number of Hours: 6.75   Musculoskeletal: Strength & Muscle Tone: within normal limits Gait & Station: normal Patient leans: N/A  Current Medications: Current Facility-Administered Medications  Medication Dose Route Frequency Provider Last Rate Last Dose  . acetaminophen (TYLENOL) tablet 650 mg  650 mg Oral Q6H PRN Kerry Hough, PA-C      . alum & mag hydroxide-simeth (MAALOX/MYLANTA) 200-200-20 MG/5ML suspension 30 mL  30 mL Oral Q4H PRN Kerry Hough, PA-C      . [START ON 03/28/2014] buPROPion (WELLBUTRIN XL) 24 hr tablet 300 mg  300 mg Oral Daily Nehemiah Massed, MD      . hydrOXYzine (ATARAX/VISTARIL) tablet 25 mg  25 mg Oral Q6H PRN Kerry Hough, PA-C      . magnesium hydroxide (MILK OF MAGNESIA) suspension 30 mL  30 mL Oral Daily PRN Kerry Hough, PA-C      . Melatonin TABS 5 mg  1 tablet Oral QHS Sanjuana Kava, NP   5 mg at 03/26/14 2007  . naltrexone (DEPADE) tablet 50 mg  50 mg Oral Daily PRN Sanjuana Kava, NP      . traZODone (DESYREL) tablet 50 mg  50 mg Oral QHS,MR X 1 Kerry Hough, PA-C        Lab Results:  No results found for this or any previous visit (from the past 48 hour(s)).  Physical Findings: AIMS: Facial and Oral Movements Muscles of Facial Expression: None, normal Lips and Perioral Area: None,  normal Jaw: None, normal Tongue: None, normal,Extremity Movements Upper (arms, wrists, hands, fingers): None, normal Lower (legs, knees, ankles, toes): None, normal, Trunk Movements Neck, shoulders, hips: None, normal, Overall Severity Severity of abnormal movements (highest score from questions above): None, normal Incapacitation due to abnormal movements: None, normal Patient's awareness of abnormal movements (rate only patient's report): No Awareness, Dental Status Current problems with teeth and/or dentures?: No Does patient usually wear dentures?: No  CIWA:  CIWA-Ar Total: 0 COWS:     Assessment: Continues to improve gradually. Some residual depression, but overall improved, and presenting with less ruminations about his adopted son, a better sense of optimism about his ability to develop a bond with child, and making efforts on this by reading material on the matter and sharing in groups. He is tolerating medication well ( Wellbutrin XL). Wife corroborates via phone that patient is imrpoving   Treatment Plan Summary: Daily contact with patient to assess and evaluate symptoms and progress in treatment Medication management See below  Plan: Continue inpatient treatment Consider discharge tomorrow as he continues to improve.  Patient and wife have expressed interest in a family meeting and we have tentatively scheduled one for tomorrow AM. Consider increasing  Wellbutrin XL to 300 mgrs QDAY Naltrexone 50 mgrs QDAY Patient will benefit from ongoing psychotherapy after discharge.   Medical Decision Making Problem Points:  Established problem, stable/improving (1), Review of last therapy session (1) and Review of psycho-social stressors (1) Data Points:  Review of new medications or change in dosage (2)  I certify that inpatient services furnished can reasonably be expected to improve the patient's condition.   Sharrod Achille 03/27/2014, 2:27 PM

## 2014-03-27 NOTE — Progress Notes (Signed)
D: Pt denies SI/HI/AVH. Pt is pleasant and cooperative. Pt stated he is ready to go home, and needs to work on Pharmacologist.   A: Pt was offered support and encouragement. Pt was given scheduled medications. Pt was encourage to attend groups. Q 15 minute checks were done for safety.   R:Pt attends groups and interacts well with peers and staff. Pt is taking medication. Pt has no complaints at this time .Pt receptive to treatment and safety maintained on unit.

## 2014-03-27 NOTE — Progress Notes (Signed)
Pt reports he is feeling better.  He was observed out of his room earlier to attend group, but he went back to his room after group was over.  He denies SI/HI/AV.  He requested his melatonin early saying that is the way he takes it at home.  He did not want the trazodone.  He did not talk with Clinical research associate about the adoption situation, but said he was feeling better about things.  Pt was encouraged to make his needs known to staff.  Support and encouragement offered.  Safety maintained with q15 minute checks.

## 2014-03-27 NOTE — [facility] Group Notes (Signed)
0900 Nursing Orientation Group    The focus of this group is to educate the patient on the purpose and policies of crisis stabilization and provide a format to answer questions about their admission.  The group details unit policies and expectations of patients while admitted.  Patient was and active participate in group. Patient was appropriate and sharing.

## 2014-03-27 NOTE — Progress Notes (Signed)
Nursing Shift Note D: No acute signs or symptoms of acute distress. Pt denies self harm thoughts, HI/AVH/Pain. Pt presents depressed affect, guarded and isolative behavior. Pt focused on discharge. Pt reports sleeping well, normal appetite, and rates depression, hopelessness and anxiety a #0. Pt is quiet and isolates on the unit. A: pt is medication compliant and attended group. Will continue to monitor Q15 minute safety checks. R: will continue to follow treatment plan, monitor and stabilize. Pt is in a safe and therapeutic milieu.

## 2014-03-27 NOTE — Progress Notes (Signed)
Adult Psychoeducational Group Note  Date:  03/27/2014 Time:  10:00am Group Topic/Focus:  Making Healthy Choices:   The focus of this group is to help patients identify negative/unhealthy choices they were using prior to admission and identify positive/healthier coping strategies to replace them upon discharge.  Participation Level:  Active  Participation Quality:  Appropriate and Attentive  Affect:  Appropriate  Cognitive:  Alert and Appropriate  Insight: Appropriate  Engagement in Group:  Engaged  Modes of Intervention:  Discussion and Education  Additional Comments:  Patient attended and participated in group. Discussion was on making lifestyle changes. Question was asked What is one positive change you can make to improve your life and current situation. Pt stated to stop isolating self and complete restoration project.  Shelly Bombard D 03/27/2014, 11:31 AM

## 2014-03-28 DIAGNOSIS — F332 Major depressive disorder, recurrent severe without psychotic features: Principal | ICD-10-CM

## 2014-03-28 MED ORDER — TRAZODONE HCL 50 MG PO TABS: 50.0000 mg | ORAL_TABLET | Freq: Every evening | ORAL | Status: DC | PRN

## 2014-03-28 MED ORDER — HYDROXYZINE HCL 25 MG PO TABS
25.0000 mg | ORAL_TABLET | Freq: Three times a day (TID) | ORAL | Status: DC | PRN
  Filled 2014-03-28 (×2): qty 12

## 2014-03-28 MED ORDER — BUPROPION HCL ER (XL) 300 MG PO TB24: 300.0000 mg | ORAL_TABLET | Freq: Every day | ORAL | Status: DC

## 2014-03-28 MED ORDER — MELATONIN 5 MG PO TABS: 1.0000 | ORAL_TABLET | Freq: Every day | ORAL | Status: DC

## 2014-03-28 MED ORDER — HYDROXYZINE HCL 25 MG PO TABS: ORAL_TABLET | ORAL | Status: DC

## 2014-03-28 MED ORDER — NALTREXONE HCL 50 MG PO TABS: 50.0000 mg | ORAL_TABLET | Freq: Every day | ORAL | Status: AC | PRN

## 2014-03-28 NOTE — Tx Team (Signed)
Interdisciplinary Treatment Plan Update (Adult) Date: 03/28/2014   Time Reviewed: 9:30 AM  Progress in Treatment: Attending groups: Yes Participating in groups: Yes Taking medication as prescribed: Yes Tolerating medication: Yes Family/Significant other contact made: Yes Patient understands diagnosis: Yes Discussing patient identified problems/goals with staff: Yes Medical problems stabilized or resolved: Yes Denies suicidal/homicidal ideation: Yes Issues/concerns per patient self-inventory: Yes Other:  New problem(s) identified: N/A  Discharge Plan or Barriers: Patient to be discharged this afternoon home with wife. He plans to follow up with [facility] for individual therapy and reports that his wife has made an appointment for family therapy as well at [facility]. Patient interested discussing psychiatrist referral with Dr. Jama Flavors.   Reason for Continuation of Hospitalization:  Depression Anxiety Medication Stabilization   Comments: N/A  Estimated length of stay: D/C today  For review of initial/current patient goals, please see plan of care.  Attendees: Patient:    Family:    Physician: Dr. Jama Flavors 03/28/2014 9:30 AM  Nursing: Corrie Dandy, RN 03/28/2014 9:30 AM  Clinical Social Worker: Samuella Bruin,  LCSWA 03/28/2014 9:30 AM  Other: Chad Cordial, LCSWA 03/28/2014 9:30 AM  Other: Leisa Lenz, Vesta [employer]er Liaison 03/28/2014 9:30 AM  Other: Onnie Boer, Case Manager 03/28/2014 9:30 AM  Other: Burnetta Sabin, RN 03/28/2014 9:30 AM  Other: Harrell Lark., RN 03/28/2014 9:30 AM  Other:    Other:    Other:    Other:     Scribe for Treatment Team:  Samuella Bruin, MSW, [employer] [phone]

## 2014-03-28 NOTE — [facility] Group Notes (Signed)
   [facility] LCSW Aftercare Discharge Planning Group Note  03/28/2014  8:45 AM   Participation Quality: Alert, Appropriate and Oriented  Mood/Affect: Appropriate  Depression Rating: 0  Anxiety Rating: 0  Thoughts of Suicide: Pt denies SI/HI  Will you contract for safety? Yes  Current AVH: Pt denies  Plan for Discharge/Comments: Pt attended discharge planning group and actively participated in group. CSW provided pt with today's workbook. Patient reports that he feels "great" this morning. He denies current SI/HI. Plans to discharge home with his wife today following a family meeting with patient's wife, patient, and psychiatrist at 54 am. Patient reports that he and his wife have an upcoming family therapy appointment at 2:30pm with [facility] and plans to attend follow up individual therapy appointment with Homero Fellers, LPC on 03/31/14. Patient reports that he feels ready to discharge this afternoon.  Transportation Means: Pt reports access to transportation  Supports: Wife, in-laws, friends are identified as supports.  Samuella Bruin, MSW, [employer] Clinical Social Worker [facility] [phone]

## 2014-03-28 NOTE — Progress Notes (Signed)
Assumed care of patient at 0300. Pt has rested without complaint. No acute distress. Level III obs in place for safety and pt is safe. Vincent Townsend

## 2014-03-28 NOTE — Progress Notes (Signed)
[facility] Adult Case Management Discharge Plan :  Will you be returning to the same living situation after discharge: Yes,  patient will be returning home with his wife. At discharge, do you have transportation home?:Yes,  patient's wife will provide transportation Do you have the ability to pay for your medications:Yes,  patient will be provided with necessary medication samples and prescriptions at discharge. Patient verbalizes his ability to obtain prescriptions.  Release of information consent forms completed and in the chart.  Patient to Follow up at: Follow-up Information   Follow up with [facility] On 03/31/2014. (Please present to [facility] on this date at 5 pm for therapy appointment with Homero Fellers, LPC. Please call office if you need to reschedule.)    Contact information:   [address] [place], [place] [address] [phone]      Follow up with [facility] On . (Please contact [facility] to schedule appointment for medication management. Appointments available beginning in mid-October. Will need to provide credit card information for $150 deposit to schedule appointment.)    Contact information:   [address], [place], [place] [address] [phone]      Patient denies SI/HI:   Yes,  denies    Safety Planning and Suicide Prevention discussed:  Yes,  with patient and wife  Shavell Nored, [place] 03/28/2014, 1:08 PM

## 2014-03-28 NOTE — Progress Notes (Signed)
Patient ID: Vincent Townsend, male   DOB: 1980/08/20, 33 y.o.   MRN: [id number] Patient discharged per physician order; patient denies SI/HI and A/V hallucinations; patient received samples, prescriptions, and copy of AVS after it was reviewed; patient had no other questions or concerns at this time; patient verbalized and signed that he received all belongings; patient left the unit ambulatory

## 2014-03-28 NOTE — [facility] Suicide Risk Assessment (Signed)
Demographic Factors:  33 year old man, married, employed, recently adopted a toddler child  Total Time spent with patient: 35 minutes  Psychiatric Specialty Exam: Physical Exam  ROS  Blood pressure 113/69, pulse 82, temperature 98.1 F (36.7 C), temperature source Oral, resp. rate 16, height  (1.727 m), weight 67.132 kg (148 lb).Body mass index is 22.51 kg/(m^2).  General Appearance: Well Groomed  Patent attorney::  Good  Speech:  Normal Rate  Volume:  Normal  Mood:  improved mood  Affect:  appropriate and reactive  Thought Process:  Goal Directed and Linear  Orientation:  Full (Time, Place, and Person)  Thought Content:  no hallucinations, no delusions  Suicidal Thoughts:  No- denies any suicidal ideations, also denies any homicidal ideations and specifically denies any violent ideations towards family or child  Homicidal Thoughts:  No  Memory:  NA  Judgement:  Good  Insight:  Fair  Psychomotor Activity:  Normal  Concentration:  Good  Recall:  Good  Fund of Knowledge:Good  Language: Good  Akathisia:  Negative  Handed:  Right  AIMS (if indicated):     Assets:  Communication Skills Desire for Improvement Housing Resilience Social Support  Sleep:  Number of Hours: 6.25    Musculoskeletal: Strength & Muscle Tone: within normal limits Gait & Station: normal Patient leans: N/A   Mental Status Per Nursing Assessment::   On Admission:     Current Mental Status by Physician: At this time patient is much improved compared to admission. He is presenting with improved mood, decreased anxiety, decreased ruminations about parenting, more optimistic, no suicidal or homicidal ideations, no psychotic symptoms.   Loss Factors: recent adoption of child  Historical Factors: history of depression. Father alcoholic  Risk Reduction Factors:   Responsible for children under 23 years of age, Sense of responsibility to family, Employed, Living with another person, especially a  relative, Positive social support and Positive coping skills or problem solving skills  Continued Clinical Symptoms:  As above, much improved  Cognitive Features That Contribute To Risk:  No gross cognitive deficits- 0x3  Suicide Risk:  Mild:  Suicidal ideation of limited frequency, intensity, duration, and specificity.  There are no identifiable plans, no associated intent, mild dysphoria and related symptoms, good self-control (both objective and subjective assessment), few other risk factors, and identifiable protective factors, including available and accessible social support.  Discharge Diagnoses:   AXIS I:  Major Depression, single episode AXIS II:  Deferred AXIS III:   Past Medical History  Diagnosis Date  . Substance abuse    AXIS IV:  problems related to social environment AXIS V:  61-70 mild symptoms ( 60-65 upon discharge)   Plan Of Care/Follow-up recommendations:  Activity:  As tolerated Diet:  Regular Tests:  NA Other:  See below  Is patient on multiple antipsychotic therapies at discharge:  No   Has Patient had three or more failed trials of antipsychotic monotherapy by history:  No  Recommended Plan for Multiple Antipsychotic Therapies: NA  Prior to discharge we had a family meeting with patient and his wife. She is very supportive. Issues were discussed- patient is feeling better and more optimistic about his ability to continue bonding with his child. Wife is in agreement with his discharge. Follow up- they are going to start family therapy later today at [facility]. Patient plans to follow up with his therapist for individual therapy - Clinton Sawyer- appt early next week Will be scheduled with an outpatient  psychiatrist here at [place]. Leaving unit in good spirits.    COBOS, FERNANDO 03/28/2014, 11:23 AM

## 2014-03-28 NOTE — Discharge Summary (Signed)
Physician Discharge Summary Note  Patient:  Vincent Townsend is an 33 y.o., male MRN:  [id number] DOB:  01-05-1981 Patient phone:  [phone] (home)  Patient address:   [address] [place] [address],  Total Time spent with patient: Greater than 30 minutes  Date of Admission:  03/24/2014 Date of Discharge: 03/28/14  Reason for Admission: Mood stabilization treatment  Discharge Diagnoses: Active Problems:   MDD (major depressive disorder)   Psychiatric Specialty Exam: Physical Exam  Psychiatric: His speech is normal and behavior is normal. Judgment and thought content normal. His mood appears not anxious. His affect is not angry, not blunt, not labile and not inappropriate. Cognition and memory are normal. He does not exhibit a depressed mood.    Review of Systems  Constitutional: Negative.   HENT: Negative.   Eyes: Negative.   Respiratory: Negative.   Cardiovascular: Negative.   Gastrointestinal: Negative.   Genitourinary: Negative.   Musculoskeletal: Negative.   Skin: Negative.   Neurological: Negative.   Endo/Heme/Allergies: Negative.   Psychiatric/Behavioral: Positive for depression (Stable) and substance abuse (Hx. alcoholism, chronic). Negative for suicidal ideas, hallucinations and memory loss. The patient has insomnia (Stable). The patient is not nervous/anxious.     Blood pressure 113/69, pulse 82, temperature 98.1 F (36.7 C), temperature source Oral, resp. rate 16, height  (1.727 m), weight 67.132 kg (148 lb).Body mass index is 22.51 kg/(m^2).   General Appearance: Well Groomed   Patent attorney:: Good   Speech: Normal Rate   Volume: Normal   Mood: improved mood   Affect: appropriate and reactive   Thought Process: Goal Directed and Linear   Orientation: Full (Time, Place, and Person)   Thought Content: no hallucinations, no delusions   Suicidal Thoughts: No- denies any suicidal ideations, also denies any homicidal ideations and specifically denies  any violent ideations towards family or child   Homicidal Thoughts: No   Memory: NA   Judgement: Good   Insight: Fair   Psychomotor Activity: Normal   Concentration: Good   Recall: Good   Fund of Knowledge:Good   Language: Good   Akathisia: Negative   Handed: Right   AIMS (if indicated):   Assets: Communication Skills  Desire for Improvement  Housing  Resilience  Social Support   Sleep: Number of Hours: 6.25    Past Psychiatric History: Diagnosis: Recurrent major depression-severe, recurrent  Hospitalizations: [facility] adult unit  Outpatient Care: [facility]  Substance Abuse Care: NA  Self-Mutilation: NA  Suicidal Attempts: NA  Violent Behaviors: NA   Musculoskeletal: Strength & Muscle Tone: within normal limits Gait & Station: normal Patient leans: N/A  DSM5: Schizophrenia Disorders:  NA Obsessive-Compulsive Disorders:  NA Trauma-Stressor Disorders:  NA Substance/Addictive Disorders:  Hx. alcoholism Depressive Disorders:  Recurrent major depression-severe, recuurent   Axis Diagnosis:  AXIS I:  Recurrent major depression-severe, recurrent AXIS II:  Cluster B Traits AXIS III:   Past Medical History  Diagnosis Date  . Substance abuse    AXIS IV:  other psychosocial or environmental problems and adjustment issues AXIS V:  63  Level of Care:  OP  Hospital Course:  This is a 33 year old Caucasian male. He reports, "This past Weekend was the hardest for me as a new parent. I never wanted to be a parent, but my wife wanted to be a mother. We tried to have a child, but unsuccessful, then came to find out that my wife had only 2% chance of conceiving a child. Then she wanted to  adopt a child. Out of the need to help my wife fulfill her dream to become a mother, I agreed. We adopted this baby boy 1 month ago from [place]. Since the arrival of the baby, I have struggled to adjust with our new life. I have a hard time bonding with this child. Caring for this baby is  overwhelming, especially feeding & changing. Every aspect of being a parent to this child is very challenging to me. Then came this past weekend when I could no longer deal with it. I became very depressed and suicidal with plans to borrow a gun and shoot myself.  After admission assessment/evaluation, it was determined based on his reported symptoms that Mr. Gopaul will need medication management to re-stabilize his depressive mood symptoms. And while a patient in this hospital,  he was ordered, received and discharged on hydroxyzine 25 mg three times daily as needed for anxiety/tension,Trazodone 50 mg Q bedtime for insomnia and Wellbutrin XL 300 mg daily for depression. He was resumed on his other pertinent home medications (Melatonin & DEPADE) for chronic insomnia and alcoholism. He also was enrolled and participated in the Group counseling sessions being offered and held on this unit. Vincent Townsend learned coping skills that should help him cope better and maintain mood stability after discharge.  Vincent Townsend was motivated for recovery. He worked closely with the treatment team and case managers to develop a discharge plan with appropriate goals. Coping skills, problem solving as well as relaxation skills were also part of the unit programming. On the day of discharge he was in much improved condition than upon admission.  His ymptoms were reported as significantly decreased, improved and or resolved completely. Upon discharge, he denies any SI/HI, AVH, delusional thoughts and or paranoia. He was motivated to continue taking his medication with a goal of continued improvement in mental health.   Vincent Townsend is to continue counseling services, psychiatric care and medication management at the [facility] here in [place], [place]. He is provided with all the necessary information needed to make these appointments without problems. He received a 4 days worth supply samples of his [facility]  discharge medications. He left [facility] with all belongings in no distress. Transportation per wife.   Consults:  psychiatry  Significant Diagnostic Studies:  labs: CBC with diff, CMP, UDS, toxicology tests, U/A  Discharge Vitals:   Blood pressure 113/69, pulse 82, temperature 98.1 F (36.7 C), temperature source Oral, resp. rate 16, height  (1.727 m), weight 67.132 kg (148 lb). Body mass index is 22.51 kg/(m^2). Lab Results:   No results found for this or any previous visit (from the past 72 hour(s)).  Physical Findings: AIMS: Facial and Oral Movements Muscles of Facial Expression: None, normal Lips and Perioral Area: None, normal Jaw: None, normal Tongue: None, normal,Extremity Movements Upper (arms, wrists, hands, fingers): None, normal Lower (legs, knees, ankles, toes): None, normal, Trunk Movements Neck, shoulders, hips: None, normal, Overall Severity Severity of abnormal movements (highest score from questions above): None, normal Incapacitation due to abnormal movements: None, normal Patient's awareness of abnormal movements (rate only patient's report): No Awareness, Dental Status Current problems with teeth and/or dentures?: No Does patient usually wear dentures?: No  CIWA:  CIWA-Ar Total: 0 COWS:     Psychiatric Specialty Exam: See Psychiatric Specialty Exam and Suicide Risk Assessment completed by Attending Physician prior to discharge.  Discharge destination:  Home  Is patient on multiple antipsychotic therapies at discharge:  No  Has Patient had three or more failed trials of antipsychotic monotherapy by history:  No  Recommended Plan for Multiple Antipsychotic Therapies: NA    Medication List       Indication   buPROPion 300 MG 24 hr tablet  Commonly known as:  WELLBUTRIN XL  Take 1 tablet (300 mg total) by mouth daily. For dpression   Indication:  Major Depressive Disorder     hydrOXYzine 25 MG tablet  Commonly known as:  ATARAX/VISTARIL  Take 1  tablet (25 mg) three times daily as needed: For anxiety   Indication:  Tension, Anxiety     Melatonin 5 MG Tabs  Take 1 tablet (5 mg total) by mouth at bedtime. For sleep   Indication:  Trouble Sleeping     naltrexone 50 MG tablet  Commonly known as:  DEPADE  Take 1 tablet (50 mg total) by mouth daily as needed (for drinking.). (Alcohol dependence)   Indication:  Excessive Use of Alcohol     traZODone 50 MG tablet  Commonly known as:  DESYREL  Take 1 tablet (50 mg total) by mouth at bedtime and may repeat dose one time if needed. For sleep   Indication:  Trouble Sleeping       Follow-up Information   Follow up with [facility] On 03/31/2014. (Please present to [facility] on this date at 5 pm for therapy appointment with Homero Fellers, LPC. Please call office if you need to reschedule.)    Contact information:   [address] [place], [place] [address] [phone]      Follow up with [facility] On 03/28/2014. (Please contact [facility] to schedule appointment for medication management. Appointments available beginning in mid-October. Will need to provide credit card information for $150 deposit to schedule appointment.)    Contact information:   [address], [place], [place] [address] [phone]     Follow-up recommendations: Activity:  As tolerated Diet: As recommended by your primary care doctor. Keep all scheduled follow-up appointments as recommended.   Comments: Take all your medications as prescribed by your mental healthcare provider. Report any adverse effects and or reactions from your medicines to your outpatient provider promptly. Patient is instructed and cautioned to not engage in alcohol and or illegal drug use while on prescription medicines. In the event of worsening symptoms, patient is instructed to call the crisis hotline, 911 and or go to the nearest ED for appropriate  evaluation and treatment of symptoms. Follow-up with your primary care provider for your other medical issues, concerns and or health care needs.   Total Discharge Time:  Greater than 30 minutes.  Signed: Sanjuana Kava, PMHNP-BC 03/28/2014, 2:57 PM  Patient seen, Suicide Assessment Completed.  Disposition Plan Reviewed

## 2014-04-01 NOTE — Clinical Social Work Note (Signed)
CSW received message from patient's wife requesting a letter regarding patient's stay at hospital. CSW called wife back and left message to get more information. Awaiting return call.  Samuella Bruin, MSW, [employer] Clinical Social Worker [facility] [phone]

## 2014-04-02 NOTE — Progress Notes (Signed)
Patient Discharge Instructions:  After Visit Summary (AVS):   Faxed to:  04/02/14 Discharge Summary Note:   Faxed to:  04/02/14 Psychiatric Admission Assessment Note:   Faxed to:  04/02/14 Suicide Risk Assessment - Discharge Assessment:   Faxed to:  04/02/14 Faxed/Sent to the Next Level Care provider:  04/02/14 Faxed to [facility] @ [phone] Faxed to [facility] @ [phone]  Jerelene Redden, 04/02/2014, 2:59 PM

## 2015-03-10 ENCOUNTER — Encounter (HOSPITAL_COMMUNITY): Payer: Self-pay | Admitting: Emergency Medicine

## 2015-03-10 ENCOUNTER — Ambulatory Visit
Admission: RE | Admit: 2015-03-10 | Discharge: 2015-03-10 | Disposition: A | Discharge status: Home / Self Care | Payer: BLUE CROSS/BLUE SHIELD | Source: Ambulatory Visit | Attending: Internal Medicine | Admitting: Internal Medicine

## 2015-03-10 ENCOUNTER — Observation Stay (HOSPITAL_COMMUNITY): Payer: BLUE CROSS/BLUE SHIELD | Admitting: Certified Registered"

## 2015-03-10 ENCOUNTER — Other Ambulatory Visit: Payer: Self-pay | Admitting: Internal Medicine

## 2015-03-10 ENCOUNTER — Observation Stay (HOSPITAL_COMMUNITY)
Admission: EM | Admit: 2015-03-10 | Discharge: 2015-03-11 | Disposition: A | Discharge status: Home / Self Care | Payer: BLUE CROSS/BLUE SHIELD | Attending: Surgery | Admitting: Surgery

## 2015-03-10 ENCOUNTER — Encounter (HOSPITAL_COMMUNITY)
Admission: EM | Disposition: A | Discharge status: Home / Self Care | Payer: Self-pay | Source: Home / Self Care | Attending: Emergency Medicine

## 2015-03-10 DIAGNOSIS — K3580 Unspecified acute appendicitis: Principal | ICD-10-CM | POA: Diagnosis present

## 2015-03-10 DIAGNOSIS — F329 Major depressive disorder, single episode, unspecified: Secondary | ICD-10-CM | POA: Insufficient documentation

## 2015-03-10 DIAGNOSIS — K3589 Other acute appendicitis without perforation or gangrene: Secondary | ICD-10-CM

## 2015-03-10 DIAGNOSIS — Z79899 Other long term (current) drug therapy: Secondary | ICD-10-CM | POA: Insufficient documentation

## 2015-03-10 DIAGNOSIS — R109 Unspecified abdominal pain: Secondary | ICD-10-CM

## 2015-03-10 HISTORY — PX: LAPAROSCOPIC APPENDECTOMY: SHX408

## 2015-03-10 LAB — URINALYSIS, ROUTINE W REFLEX MICROSCOPIC
BILIRUBIN URINE: NEGATIVE
GLUCOSE, UA: NEGATIVE mg/dL
Hgb urine dipstick: NEGATIVE
KETONES UR: 15 mg/dL — AB
Leukocytes, UA: NEGATIVE
Nitrite: NEGATIVE
Protein, ur: NEGATIVE mg/dL
Specific Gravity, Urine: 1.018 (ref 1.005–1.030)
Urobilinogen, UA: 0.2 mg/dL (ref 0.0–1.0)
pH: 6 (ref 5.0–8.0)

## 2015-03-10 LAB — CBC
HEMATOCRIT: 45.8 % (ref 39.0–52.0)
HEMOGLOBIN: 15.5 g/dL (ref 13.0–17.0)
MCH: 32.4 pg (ref 26.0–34.0)
MCHC: 33.8 g/dL (ref 30.0–36.0)
MCV: 95.8 fL (ref 78.0–100.0)
PLATELETS: 182 10*3/uL (ref 150–400)
RBC: 4.78 MIL/uL (ref 4.22–5.81)
RDW: 13.2 % (ref 11.5–15.5)
WBC: 10.4 10*3/uL (ref 4.0–10.5)

## 2015-03-10 LAB — SURGICAL PCR SCREEN
MRSA, PCR: NEGATIVE
Staphylococcus aureus: NEGATIVE

## 2015-03-10 LAB — COMPREHENSIVE METABOLIC PANEL
ALK PHOS: 37 U/L — AB (ref 38–126)
ALT: 20 U/L (ref 17–63)
AST: 26 U/L (ref 15–41)
Albumin: 4.4 g/dL (ref 3.5–5.0)
Anion gap: 12 (ref 5–15)
BUN: 8 mg/dL (ref 6–20)
CO2: 28 mmol/L (ref 22–32)
Calcium: 9.5 mg/dL (ref 8.9–10.3)
Chloride: 97 mmol/L — ABNORMAL LOW (ref 101–111)
Creatinine, Ser: 0.8 mg/dL (ref 0.61–1.24)
GFR calc Af Amer: >60 mL/min (ref >60)
GFR calc non Af Amer: >60 mL/min (ref >60)
Glucose, Bld: 85 mg/dL (ref 65–99)
POTASSIUM: 3.9 mmol/L (ref 3.5–5.1)
Sodium: 137 mmol/L (ref 135–145)
Total Bilirubin: 1.3 mg/dL — ABNORMAL HIGH (ref 0.3–1.2)
Total Protein: 7.7 g/dL (ref 6.5–8.1)

## 2015-03-10 LAB — LIPASE, BLOOD: Lipase: 14 U/L — ABNORMAL LOW (ref 22–51)

## 2015-03-10 SURGERY — APPENDECTOMY, LAPAROSCOPIC
Anesthesia: General | Site: Abdomen

## 2015-03-10 MED ORDER — METRONIDAZOLE IN NACL 5-0.79 MG/ML-% IV SOLN
500.0000 mg | Freq: Three times a day (TID) | INTRAVENOUS | Status: DC
  Administered 2015-03-10 – 2015-03-11 (×3): 500 mg via INTRAVENOUS
  Filled 2015-03-10 (×5): qty 100

## 2015-03-10 MED ORDER — KETOROLAC TROMETHAMINE 30 MG/ML IJ SOLN
INTRAMUSCULAR | Status: DC | PRN
  Administered 2015-03-10: 30 mg via INTRAVENOUS

## 2015-03-10 MED ORDER — ACETAMINOPHEN 650 MG RE SUPP: 650.0000 mg | Freq: Four times a day (QID) | RECTAL | Status: DC | PRN

## 2015-03-10 MED ORDER — HYDROCODONE-ACETAMINOPHEN 5-325 MG PO TABS
1.0000 | ORAL_TABLET | ORAL | Status: DC | PRN
  Administered 2015-03-11 (×3): 1 via ORAL
  Filled 2015-03-10 (×3): qty 1

## 2015-03-10 MED ORDER — ROCURONIUM BROMIDE 100 MG/10ML IV SOLN
INTRAVENOUS | Status: DC | PRN
  Administered 2015-03-10: 30 mg via INTRAVENOUS

## 2015-03-10 MED ORDER — DIPHENHYDRAMINE HCL 50 MG/ML IJ SOLN: 12.5000 mg | Freq: Four times a day (QID) | INTRAMUSCULAR | Status: DC | PRN

## 2015-03-10 MED ORDER — BUPIVACAINE-EPINEPHRINE 0.25% -1:200000 IJ SOLN
INTRAMUSCULAR | Status: AC
  Filled 2015-03-10: qty 1

## 2015-03-10 MED ORDER — PHENOL 1.4 % MT LIQD: 2.0000 | OROMUCOSAL | Status: DC | PRN

## 2015-03-10 MED ORDER — LIP MEDEX EX OINT
1.0000 "application " | TOPICAL_OINTMENT | Freq: Two times a day (BID) | CUTANEOUS | Status: DC
  Administered 2015-03-11: 1 via TOPICAL
  Filled 2015-03-10: qty 7

## 2015-03-10 MED ORDER — GLYCOPYRROLATE 0.2 MG/ML IJ SOLN
INTRAMUSCULAR | Status: DC | PRN
  Administered 2015-03-10: 0.4 mg via INTRAVENOUS

## 2015-03-10 MED ORDER — 0.9 % SODIUM CHLORIDE (POUR BTL) OPTIME
TOPICAL | Status: DC | PRN
  Administered 2015-03-10: 1000 mL

## 2015-03-10 MED ORDER — SUCCINYLCHOLINE CHLORIDE 20 MG/ML IJ SOLN
INTRAMUSCULAR | Status: DC | PRN
  Administered 2015-03-10: 100 mg via INTRAVENOUS

## 2015-03-10 MED ORDER — PROPOFOL 10 MG/ML IV BOLUS
INTRAVENOUS | Status: DC | PRN
  Administered 2015-03-10: 150 mg via INTRAVENOUS

## 2015-03-10 MED ORDER — LACTATED RINGERS IV SOLN
INTRAVENOUS | Status: DC | PRN
  Administered 2015-03-10: 20:00:00 via INTRAVENOUS

## 2015-03-10 MED ORDER — KCL IN DEXTROSE-NACL 20-5-0.45 MEQ/L-%-% IV SOLN
INTRAVENOUS | Status: DC
  Administered 2015-03-10: 19:00:00 via INTRAVENOUS
  Filled 2015-03-10 (×3): qty 1000

## 2015-03-10 MED ORDER — MEPERIDINE HCL 50 MG/ML IJ SOLN
6.2500 mg | INTRAMUSCULAR | Status: DC | PRN
  Administered 2015-03-10: 6.25 mg via INTRAVENOUS

## 2015-03-10 MED ORDER — ONDANSETRON HCL 4 MG/2ML IJ SOLN
INTRAMUSCULAR | Status: DC | PRN
  Administered 2015-03-10: 4 mg via INTRAVENOUS

## 2015-03-10 MED ORDER — MEPERIDINE HCL 50 MG/ML IJ SOLN
INTRAMUSCULAR | Status: AC
  Filled 2015-03-10: qty 1

## 2015-03-10 MED ORDER — PROMETHAZINE HCL 25 MG/ML IJ SOLN
6.2500 mg | INTRAMUSCULAR | Status: DC | PRN
Start: 2015-03-10 — End: 2015-03-10

## 2015-03-10 MED ORDER — LIDOCAINE HCL (PF) 2 % IJ SOLN
INTRAMUSCULAR | Status: DC | PRN
  Administered 2015-03-10: 30 mg via INTRADERMAL

## 2015-03-10 MED ORDER — IOPAMIDOL (ISOVUE-300) INJECTION 61%
100.0000 mL | Freq: Once | INTRAVENOUS | Status: AC | PRN
  Administered 2015-03-10: 100 mL via INTRAVENOUS

## 2015-03-10 MED ORDER — FENTANYL CITRATE (PF) 100 MCG/2ML IJ SOLN
INTRAMUSCULAR | Status: DC | PRN
  Administered 2015-03-10 (×3): 50 ug via INTRAVENOUS
  Administered 2015-03-10: 100 ug via INTRAVENOUS

## 2015-03-10 MED ORDER — ONDANSETRON HCL 4 MG/2ML IJ SOLN: 4.0000 mg | Freq: Four times a day (QID) | INTRAMUSCULAR | Status: DC | PRN

## 2015-03-10 MED ORDER — BUPIVACAINE-EPINEPHRINE 0.25% -1:200000 IJ SOLN
INTRAMUSCULAR | Status: DC | PRN
  Administered 2015-03-10: 50 mL

## 2015-03-10 MED ORDER — LACTATED RINGERS IV SOLN: INTRAVENOUS | Status: DC

## 2015-03-10 MED ORDER — BISACODYL 10 MG RE SUPP
10.0000 mg | Freq: Two times a day (BID) | RECTAL | Status: DC | PRN
  Filled 2015-03-10: qty 1

## 2015-03-10 MED ORDER — MENTHOL 3 MG MT LOZG: 1.0000 | LOZENGE | OROMUCOSAL | Status: DC | PRN

## 2015-03-10 MED ORDER — METOPROLOL TARTRATE 12.5 MG HALF TABLET: 12.5000 mg | ORAL_TABLET | Freq: Two times a day (BID) | ORAL | Status: DC | PRN

## 2015-03-10 MED ORDER — MAGIC MOUTHWASH: 15.0000 mL | Freq: Four times a day (QID) | ORAL | Status: DC | PRN

## 2015-03-10 MED ORDER — HYDROMORPHONE HCL 1 MG/ML IJ SOLN: 1.0000 mg | INTRAMUSCULAR | Status: DC | PRN

## 2015-03-10 MED ORDER — ALUM & MAG HYDROXIDE-SIMETH 200-200-20 MG/5ML PO SUSP
30.0000 mL | Freq: Four times a day (QID) | ORAL | Status: DC | PRN
  Filled 2015-03-10: qty 30

## 2015-03-10 MED ORDER — LACTATED RINGERS IV BOLUS (SEPSIS): 1000.0000 mL | Freq: Three times a day (TID) | INTRAVENOUS | Status: DC | PRN

## 2015-03-10 MED ORDER — METOPROLOL TARTRATE 1 MG/ML IV SOLN
5.0000 mg | Freq: Four times a day (QID) | INTRAVENOUS | Status: DC | PRN
  Filled 2015-03-10: qty 5

## 2015-03-10 MED ORDER — NAPROXEN 500 MG PO TABS
500.0000 mg | ORAL_TABLET | Freq: Two times a day (BID) | ORAL | Status: DC
  Administered 2015-03-11: 500 mg via ORAL
  Filled 2015-03-10 (×4): qty 1

## 2015-03-10 MED ORDER — DEXAMETHASONE SODIUM PHOSPHATE 10 MG/ML IJ SOLN
INTRAMUSCULAR | Status: DC | PRN
  Administered 2015-03-10: 10 mg via INTRAVENOUS

## 2015-03-10 MED ORDER — PROMETHAZINE HCL 25 MG/ML IJ SOLN: 6.2500 mg | INTRAMUSCULAR | Status: DC | PRN

## 2015-03-10 MED ORDER — ONDANSETRON 4 MG PO TBDP: 4.0000 mg | ORAL_TABLET | Freq: Four times a day (QID) | ORAL | Status: DC | PRN

## 2015-03-10 MED ORDER — MIDAZOLAM HCL 5 MG/5ML IJ SOLN
INTRAMUSCULAR | Status: DC | PRN
  Administered 2015-03-10: 2 mg via INTRAVENOUS

## 2015-03-10 MED ORDER — NEOSTIGMINE METHYLSULFATE 10 MG/10ML IV SOLN
INTRAVENOUS | Status: DC | PRN
  Administered 2015-03-10: 3 mg via INTRAVENOUS

## 2015-03-10 MED ORDER — DEXTROSE 5 % IV SOLN
2.0000 g | INTRAVENOUS | Status: DC
  Administered 2015-03-10: 2 g via INTRAVENOUS
  Filled 2015-03-10 (×2): qty 2

## 2015-03-10 MED ORDER — LACTATED RINGERS IR SOLN
Status: DC | PRN
  Administered 2015-03-10: 3000 mL

## 2015-03-10 MED ORDER — HYDROMORPHONE HCL 1 MG/ML IJ SOLN
0.2500 mg | INTRAMUSCULAR | Status: DC | PRN
  Administered 2015-03-10 (×2): 0.5 mg via INTRAVENOUS

## 2015-03-10 MED ORDER — ACETAMINOPHEN 325 MG PO TABS: 650.0000 mg | ORAL_TABLET | Freq: Four times a day (QID) | ORAL | Status: DC | PRN

## 2015-03-10 MED ORDER — HYDROMORPHONE HCL 1 MG/ML IJ SOLN
INTRAMUSCULAR | Status: AC
  Administered 2015-03-10: 0.5 mg via INTRAVENOUS
  Filled 2015-03-10: qty 1

## 2015-03-10 SURGICAL SUPPLY — 41 items
APPLIER CLIP 5 13 M/L LIGAMAX5 (MISCELLANEOUS)
APPLIER CLIP ROT 10 11.4 M/L (STAPLE)
APR CLP MED LRG 11.4X10 (STAPLE)
APR CLP MED LRG 5 ANG JAW (MISCELLANEOUS)
BAG SPEC RTRVL LRG 6X4 10 (ENDOMECHANICALS) ×1
CLIP APPLIE 5 13 M/L LIGAMAX5 (MISCELLANEOUS) IMPLANT
CLIP APPLIE ROT 10 11.4 M/L (STAPLE) IMPLANT
COVER SURGICAL LIGHT HANDLE (MISCELLANEOUS) ×3 IMPLANT
CUTTER FLEX LINEAR 45M (STAPLE) ×3 IMPLANT
DECANTER SPIKE VIAL GLASS SM (MISCELLANEOUS) ×3 IMPLANT
DEVICE TROCAR PUNCTURE CLOSURE (ENDOMECHANICALS) IMPLANT
DRAPE LAPAROSCOPIC ABDOMINAL (DRAPES) ×3 IMPLANT
DRAPE WARM FLUID 44X44 (DRAPE) IMPLANT
DRSG TEGADERM 2-3/8X2-3/4 SM (GAUZE/BANDAGES/DRESSINGS) ×6 IMPLANT
DRSG TEGADERM 4X4.75 (GAUZE/BANDAGES/DRESSINGS) ×3 IMPLANT
ELECT REM PT RETURN 9FT ADLT (ELECTROSURGICAL) ×3
ELECTRODE REM PT RTRN 9FT ADLT (ELECTROSURGICAL) ×1 IMPLANT
ENDOLOOP SUT PDS II  0 18 (SUTURE)
ENDOLOOP SUT PDS II 0 18 (SUTURE) IMPLANT
GAUZE SPONGE 2X2 8PLY STRL LF (GAUZE/BANDAGES/DRESSINGS) ×1 IMPLANT
GLOVE ECLIPSE 8.0 STRL XLNG CF (GLOVE) ×3 IMPLANT
GLOVE INDICATOR 8.0 STRL GRN (GLOVE) ×3 IMPLANT
GOWN STRL REUS W/TWL XL LVL3 (GOWN DISPOSABLE) ×9 IMPLANT
KIT BASIN OR (CUSTOM PROCEDURE TRAY) ×3 IMPLANT
PEN SKIN MARKING BROAD (MISCELLANEOUS) ×3 IMPLANT
POUCH SPECIMEN RETRIEVAL 10MM (ENDOMECHANICALS) ×3 IMPLANT
RELOAD 45 VASCULAR/THIN (ENDOMECHANICALS) IMPLANT
RELOAD STAPLE TA45 3.5 REG BLU (ENDOMECHANICALS) ×3 IMPLANT
SCISSORS LAP 5X35 DISP (ENDOMECHANICALS) ×3 IMPLANT
SET IRRIG TUBING LAPAROSCOPIC (IRRIGATION / IRRIGATOR) ×3 IMPLANT
SHEARS HARMONIC ACE PLUS 36CM (ENDOMECHANICALS) IMPLANT
SLEEVE XCEL OPT CAN 5 100 (ENDOMECHANICALS) ×3 IMPLANT
SPONGE GAUZE 2X2 STER 10/PKG (GAUZE/BANDAGES/DRESSINGS) ×2
SUT MNCRL AB 4-0 PS2 18 (SUTURE) ×3 IMPLANT
SUT SILK 2 0 SH (SUTURE) IMPLANT
TOWEL OR 17X26 10 PK STRL BLUE (TOWEL DISPOSABLE) ×3 IMPLANT
TRAY FOLEY W/METER SILVER 14FR (SET/KITS/TRAYS/PACK) ×3 IMPLANT
TRAY FOLEY W/METER SILVER 16FR (SET/KITS/TRAYS/PACK) IMPLANT
TRAY LAPAROSCOPIC (CUSTOM PROCEDURE TRAY) ×3 IMPLANT
TROCAR BLADELESS OPT 5 100 (ENDOMECHANICALS) ×3 IMPLANT
TROCAR XCEL 12X100 BLDLESS (ENDOMECHANICALS) ×3 IMPLANT

## 2015-03-10 NOTE — Discharge Instructions (Signed)
LAPAROSCOPIC SURGERY: POST OP INSTRUCTIONS ° °1. DIET: Follow a light bland diet the first 24 hours after arrival home, such as soup, liquids, crackers, etc.  Be sure to include lots of fluids daily.  Avoid fast food or heavy meals as your are more likely to get nauseated.  Eat a low fat the next few days after surgery.   °2. Take your usually prescribed home medications unless otherwise directed. °3. PAIN CONTROL: °a. Pain is best controlled by a usual combination of three different methods TOGETHER: °i. Ice/Heat °ii. Over the counter pain medication °iii. Prescription pain medication °b. Most patients will experience some swelling and bruising around the incisions.  Ice packs or heating pads (30-60 minutes up to 6 times a day) will help. Use ice for the first few days to help decrease swelling and bruising, then switch to heat to help relax tight/sore spots and speed recovery.  Some people prefer to use ice alone, heat alone, alternating between ice & heat.  Experiment to what works for you.  Swelling and bruising can take several weeks to resolve.   °c. It is helpful to take an over-the-counter pain medication regularly for the first few weeks.  Choose one of the following that works best for you: °i. Naproxen (Aleve, etc)  Two 220mg tabs twice a day °ii. Ibuprofen (Advil, etc) Three 200mg tabs four times a day (every meal & bedtime) °iii. Acetaminophen (Tylenol, etc) 500-650mg four times a day (every meal & bedtime) °d. A  prescription for pain medication (such as oxycodone, hydrocodone, etc) should be given to you upon discharge.  Take your pain medication as prescribed.  °i. If you are having problems/concerns with the prescription medicine (does not control pain, nausea, vomiting, rash, itching, etc), please call us [phone] to see if we need to switch you to a different pain medicine that will work better for you and/or control your side effect better. °ii. If you need a refill on your pain medication,  please contact your pharmacy.  They will contact our office to request authorization. Prescriptions will not be filled after 5 pm or on week-ends. °4. Avoid getting constipated.  Between the surgery and the pain medications, it is common to experience some constipation.  Increasing fluid intake and taking a fiber supplement (such as Metamucil, Citrucel, FiberCon, MiraLax, etc) 1-2 times a day regularly will usually help prevent this problem from occurring.  A mild laxative (prune juice, Milk of Magnesia, MiraLax, etc) should be taken according to package directions if there are no bowel movements after 48 hours.   °5. Watch out for diarrhea.  If you have many loose bowel movements, simplify your diet to bland foods & liquids for a few days.  Stop any stool softeners and decrease your fiber supplement.  Switching to mild anti-diarrheal medications (Kayopectate, Pepto Bismol) can help.  If this worsens or does not improve, please call us. °6. Wash / shower every day.  You may shower over the dressings as they are waterproof.  Continue to shower over incision(s) after the dressing is off. °7. Remove your waterproof bandages 5 days after surgery.  You may leave the incision open to air.  You may replace a dressing/Band-Aid to cover the incision for comfort if you wish.  °8. ACTIVITIES as tolerated:   °a. You may resume regular (light) daily activities beginning the next day--such as daily self-care, walking, climbing stairs--gradually increasing activities as tolerated.  If you can walk 30 minutes without difficulty, it   is safe to try more intense activity such as jogging, treadmill, bicycling, low-impact aerobics, swimming, etc. °b. Save the most intensive and strenuous activity for last such as sit-ups, heavy lifting, contact sports, etc  Refrain from any heavy lifting or straining until you are off narcotics for pain control.   °c. DO NOT PUSH THROUGH PAIN.  Let pain be your guide: If it hurts to do something, don't  do it.  Pain is your body warning you to avoid that activity for another week until the pain goes down. °d. You may drive when you are no longer taking prescription pain medication, you can comfortably wear a seatbelt, and you can safely maneuver your car and apply brakes. °e. You may have sexual intercourse when it is comfortable.  °9. FOLLOW UP in our office °a. Please call [facility] at [phone] to set up an appointment to see your surgeon in the office for a follow-up appointment approximately 2-3 weeks after your surgery. °b. Make sure that you call for this appointment the day you arrive home to insure a convenient appointment time. °10. IF YOU HAVE DISABILITY OR FAMILY LEAVE FORMS, BRING THEM TO THE OFFICE FOR PROCESSING.  DO NOT GIVE THEM TO YOUR DOCTOR. ° ° °WHEN TO CALL US [phone]: °1. Poor pain control °2. Reactions / problems with new medications (rash/itching, nausea, etc)  °3. Fever over 101.5 F (38.5 C) °4. Inability to urinate °5. Nausea and/or vomiting °6. Worsening swelling or bruising °7. Continued bleeding from incision. °8. Increased pain, redness, or drainage from the incision ° ° The clinic staff is available to answer your questions during regular business hours (8:30am-5pm).  Please don’t hesitate to call and ask to speak to one of our nurses for clinical concerns.  ° If you have a medical emergency, go to the nearest emergency room or call 911. ° A surgeon from [facility] is always on call at the hospitals ° ° °[facility] °[address], [address], [place], [place]  [address] ? °MAIN: [phone] ? TOLL FREE: [phone] ?  °FAX [phone] °[email or web address] ° ° °Appendicitis °Appendicitis is when the appendix is swollen (inflamed). The inflammation can lead to developing a hole (perforation) and a collection of pus (abscess). °CAUSES  °There is not always an obvious cause of appendicitis. Sometimes it is caused by an  obstruction in the appendix. The obstruction can be caused by: °· A small, hard, pea-sized ball of stool (fecalith). °· Enlarged lymph glands in the appendix. °SYMPTOMS  °· Pain around your belly button (navel) that moves toward your lower right belly (abdomen). The pain can become more severe and sharp as time passes. °· Tenderness in the lower right abdomen. Pain gets worse if you cough or make a sudden movement. °· Feeling sick to your stomach (nauseous). °· Throwing up (vomiting). °· Loss of appetite. °· Fever. °· Constipation. °· Diarrhea. °· Generally not feeling well. °DIAGNOSIS  °· Physical exam. °· Blood tests. °· Urine test. °· X-rays or a CT scan may confirm the diagnosis. °TREATMENT  °Once the diagnosis of appendicitis is made, the most common treatment is to remove the appendix as soon as possible. This procedure is called appendectomy. In an open appendectomy, a cut (incision) is made in the lower right abdomen and the appendix is removed. In a laparoscopic appendectomy, usually 3 small incisions are made. Long, thin instruments and a camera tube are used to remove the appendix. Most patients go home   in 24 to 48 hours after appendectomy. °In some situations, the appendix may have already perforated and an abscess may have formed. The abscess may have a "wall" around it as seen on a CT scan. In this case, a drain may be placed into the abscess to remove fluid, and you may be treated with antibiotic medicines that kill germs. The medicine is given through a tube in your vein (IV). Once the abscess has resolved, it may or may not be necessary to have an appendectomy. You may need to stay in the hospital longer than 48 hours. °Document Released: 07/18/2005 Document Revised: 01/17/2012 Document Reviewed: 10/13/2009 °[employer]® Patient Information ©2015 [employer]. This information is not intended to replace advice given to you by your health care provider. Make sure you discuss any questions you have with  your health care provider. ° ° °Managing Pain ° °Pain after surgery or related to activity is often due to strain/injury to muscle, tendon, nerves and/or incisions.  This pain is usually short-term and will improve in a few months.  ° °Many people find it helpful to do the following things TOGETHER to help speed the process of healing and to get back to regular activity more quickly: ° °1. Avoid heavy physical activity at first °a. No lifting greater than 20 pounds at first, then increase to lifting as tolerated over the next few weeks °b. Do not “push through” the pain.  Listen to your body and avoid positions and maneuvers than reproduce the pain.  Wait a few days before trying something more intense °c. Walking is okay as tolerated, but go slowly and stop when getting sore.  If you can walk 30 minutes without stopping or pain, you can try more intense activity (running, jogging, aerobics, cycling, swimming, treadmill, sex, sports, weightlifting, etc ) °d. Remember: If it hurts to do it, then don’t do it! ° °2. Take Anti-inflammatory medication °a. Choose ONE of the following over-the-counter medications: °i.            Acetaminophen 500mg tabs (Tylenol) 1-2 pills with every meal and just before bedtime (avoid if you have liver problems) °ii.            Naproxen 220mg tabs (ex. Aleve) 1-2 pills twice a day (avoid if you have kidney, stomach, IBD, or bleeding problems) °iii. Ibuprofen 200mg tabs (ex. Advil, Motrin) 3-4 pills with every meal and just before bedtime (avoid if you have kidney, stomach, IBD, or bleeding problems) °b. Take with food/snack around the clock for 1-2 weeks °i. This helps the muscle and nerve tissues become less irritable and calm down faster ° °3. Use a Heating pad or Ice/Cold Pack °a. 4-6 times a day °b. May use warm bath/hottub  or showers ° °4. Try Gentle Massage and/or Stretching  °a. at the area of pain many times a day °b. stop if you feel pain - do not overdo it ° °Try these steps  together to help you body heal faster and avoid making things get worse.  Doing just one of these things may not be enough.   ° °If you are not getting better after two weeks or are noticing you are getting worse, contact our office for further advice; we may need to re-evaluate you & see what other things we can do to help. ° °GETTING TO GOOD BOWEL HEALTH. °Irregular bowel habits such as constipation and diarrhea can lead to many problems over time.  Having one soft bowel movement a day   is the most important way to prevent further problems.  The anorectal canal is designed to handle stretching and feces to safely manage our ability to get rid of solid waste (feces, poop, stool) out of our body.  BUT, hard constipated stools can act like ripping concrete bricks and diarrhea can be a burning fire to this very sensitive area of our body, causing inflamed hemorrhoids, anal fissures, increasing risk is perirectal abscesses, abdominal pain/bloating, an making irritable bowel worse.     ° °The goal: ONE SOFT BOWEL MOVEMENT A DAY!  To have soft, regular bowel movements:  °• Drink plenty of fluids, consider 4-6 tall glasses of water a day.   °• Take plenty of fiber.  Fiber is the undigested part of plant food that passes into the colon, acting s “natures broom” to encourage bowel motility and movement.  Fiber can absorb and hold large amounts of water. This results in a larger, bulkier stool, which is soft and easier to pass. Work gradually over several weeks up to 6 servings a day of fiber (25g a day even more if needed) in the form of: °o Vegetables -- Root (potatoes, carrots, turnips), leafy green (lettuce, salad greens, celery, spinach), or cooked high residue (cabbage, broccoli, etc) °o Fruit -- Fresh (unpeeled skin & pulp), Dried (prunes, apricots, cherries, etc ),  or stewed ( applesauce)  °o Whole grain breads, pasta, etc (whole wheat)  °o Bran cereals  °• Bulking Agents -- This type of water-retaining fiber  generally is easily obtained each day by one of the following:  °o Psyllium bran -- The psyllium plant is remarkable because its ground seeds can retain so much water. This product is available as Metamucil, Konsyl, Effersyllium, Per Diem Fiber, or the less expensive generic preparation in drug and health food stores. Although labeled a laxative, it really is not a laxative.  °o Methylcellulose -- This is another fiber derived from wood which also retains water. It is available as Citrucel. °o Polyethylene Glycol - and “artificial” fiber commonly called Miralax or Glycolax.  It is helpful for people with gassy or bloated feelings with regular fiber °o Flax Seed - a less gassy fiber than psyllium °• No reading or other relaxing activity while on the toilet. If bowel movements take longer than 5 minutes, you are too constipated °• AVOID CONSTIPATION.  High fiber and water intake usually takes care of this.  Sometimes a laxative is needed to stimulate more frequent bowel movements, but  °• Laxatives are not a good long-term solution as it can wear the colon out.  They can help jump-start bowels if constipated, but should be relied on constantly without discussing with your doctor °o Osmotics (Milk of Magnesia, Fleets phosphosoda, Magnesium citrate, MiraLax, GoLytely) are safer than  °o Stimulants (Senokot, Castor Oil, Dulcolax, Ex Lax)    °o Avoid taking laxatives for more than 7 days in a row. °•  IF SEVERELY CONSTIPATED, try a Bowel Retraining Program: °o Do not use laxatives.  °o Eat a diet high in roughage, such as bran cereals and leafy vegetables.  °o Drink six (6) ounces of prune or apricot juice each morning.  °o Eat two (2) large servings of stewed fruit each day.  °o Take one (1) heaping tablespoon of a psyllium-based bulking agent twice a day. Use sugar-free sweetener when possible to avoid excessive calories.  °o Eat a normal breakfast.  °o Set aside 15 minutes after breakfast to sit on the toilet, but do    not strain to have a bowel movement.  °o If you do not have a bowel movement by the third day, use an enema and repeat the above steps.  °• Controlling diarrhea °o Switch to liquids and simpler foods for a few days to avoid stressing your intestines further. °o Avoid dairy products (especially milk & ice cream) for a short time.  The intestines often can lose the ability to digest lactose when stressed. °o Avoid foods that cause gassiness or bloating.  Typical foods include beans and other legumes, cabbage, broccoli, and dairy foods.  Every person has some sensitivity to other foods, so listen to our body and avoid those foods that trigger problems for you. °o Adding fiber (Citrucel, Metamucil, psyllium, Miralax) gradually can help thicken stools by absorbing excess fluid and retrain the intestines to act more normally.  Slowly increase the dose over a few weeks.  Too much fiber too soon can backfire and cause cramping & bloating. °o Probiotics (such as active yogurt, Align, etc) may help repopulate the intestines and colon with normal bacteria and calm down a sensitive digestive tract.  Most studies show it to be of mild help, though, and such products can be costly. °o Medicines: °- Bismuth subsalicylate (ex. Kayopectate, Pepto Bismol) every 30 minutes for up to 6 doses can help control diarrhea.  Avoid if pregnant. °- Loperamide (Immodium) can slow down diarrhea.  Start with two tablets (4mg total) first and then try one tablet every 6 hours.  Avoid if you are having fevers or severe pain.  If you are not better or start feeling worse, stop all medicines and call your doctor for advice °o Call your doctor if you are getting worse or not better.  Sometimes further testing (cultures, endoscopy, X-ray studies, bloodwork, etc) may be needed to help diagnose and treat the cause of the diarrhea. ° °TROUBLESHOOTING IRREGULAR BOWELS °1) Avoid extremes of bowel movements (no bad constipation/diarrhea) °2) Miralax 17gm  mixed in 8oz. water or juice-daily. May use BID as needed.  °3) Gas-x,Phazyme, etc. as needed for gas & bloating.  °4) Soft,bland diet. No spicy,greasy,fried foods.  °5) Prilosec over-the-counter as needed  °6) May hold gluten/wheat products from diet to see if symptoms improve.  °7)  May try probiotics (Align, Activa, etc) to help calm the bowels down °7) If symptoms become worse call back immediately. ° °

## 2015-03-10 NOTE — H&P (Signed)
Vincent Townsend is an 34 y.o. male.    General Surgery [facility]  Chief Complaint: acute appendicitis, abdominal pain  HPI: patient is a 34 yo WM with 24 hr hx of abdominal pain localizing to the RLQ.  Patient notes intial pain right mid abdomen.  Mild nausea, no emesis.  Sweats, no fever.  Presented to primary MD office, sent for CT scan positive for acute appendicitis.  WBC 10.4.  No prior abdominal surgery.  No allergies.  No current meds.  [employer].  Family at bedside.  Past Medical History  Diagnosis Date  . Substance abuse     Past Surgical History  Procedure Laterality Date  . Wrist surgery      History reviewed. No pertinent family history. Social History:  reports that he has never smoked. He does not have any smokeless tobacco history on file. He reports that he does not drink alcohol or use illicit drugs.  Allergies: No Known Allergies   (Not in a hospital admission)  Results for orders placed or performed during the hospital encounter of 03/10/15 (from the past 48 hour(s))  CBC     Status: None   Collection Time: 03/10/15  4:10 PM  Result Value Ref Range   WBC 10.4 4.0 - 10.5 K/uL   RBC 4.78 4.22 - 5.81 MIL/uL   Hemoglobin 15.5 13.0 - 17.0 g/dL   HCT 40.9 81.1 - 91.4 %   MCV 95.8 78.0 - 100.0 fL   MCH 32.4 26.0 - 34.0 pg   MCHC 33.8 30.0 - 36.0 g/dL   RDW 78.2 95.6 - 21.3 %   Platelets 182 150 - 400 K/uL   Ct Abdomen Pelvis W Contrast  03/10/2015   CLINICAL DATA:  Right lower quadrant pain since last night  EXAM: CT ABDOMEN AND PELVIS WITH CONTRAST  TECHNIQUE: Multidetector CT imaging of the abdomen and pelvis was performed using the standard protocol following bolus administration of intravenous contrast.  CONTRAST:  ISOVUE-300 IOPAMIDOL (ISOVUE-300) INJECTION 61%  COMPARISON:  None.  FINDINGS: Lower chest:  Lung bases are clear.  Normal heart size.  Hepatobiliary: Normal liver.  Normal gallbladder.  Pancreas: Normal.   Spleen: Normal.  Adrenals/Urinary Tract: Normal adrenal glands. Normal kidneys. No obstructive uropathy. Normal bladder.  Stomach/Bowel: No bowel wall thickening. Multiple loops of fluid-filled small bowel and colon. Air-fluid level in the stomach. No pneumatosis, pneumoperitoneum or portal venous gas. No abdominal or pelvic free fluid. Mildly thickened appendix measuring 8.4 mm with mild surrounding hazy inflammatory changes. No periappendiceal fluid collection.  Vascular/Lymphatic: Normal caliber aorta. No abdominal or pelvic lymphadenopathy.  Reproductive: Normal prostate.  Other: No fluid collection or hematoma.  Musculoskeletal: No acute osseous abnormality. No lytic or sclerotic osseous lesion.  IMPRESSION: 1. Findings consistent with acute appendicitis. No focal fluid collection to suggest an abscess or perforation.   Electronically Signed   By: Elige Ko   On: 03/10/2015 15:04    Review of Systems  Constitutional: Positive for diaphoresis.  HENT: Negative.   Eyes: Negative.   Respiratory: Negative.   Cardiovascular: Negative.   Gastrointestinal: Positive for nausea, abdominal pain (right side of abdomen) and diarrhea. Negative for vomiting.  Genitourinary: Negative.   Musculoskeletal: Negative.   Skin: Negative.   Neurological: Negative.   Endo/Heme/Allergies: Negative.   Psychiatric/Behavioral: Negative.     Blood pressure 131/84, pulse 86, temperature 98.1 F (36.7 C), temperature source Oral, resp. rate 18, SpO2 98 %. Physical Exam  Constitutional: He is  oriented to person, place, and time. He appears well-developed and well-nourished. No distress.  HENT:  Head: Normocephalic and atraumatic.  Right Ear: External ear normal.  Left Ear: External ear normal.  Eyes: Conjunctivae are normal. Pupils are equal, round, and reactive to light. No scleral icterus.  Neck: Normal range of motion. Neck supple. No tracheal deviation present. No thyromegaly present.  Cardiovascular: Normal  rate, regular rhythm and normal heart sounds.   No murmur heard. Respiratory: Effort normal and breath sounds normal. No respiratory distress. He has no wheezes.  GI: Soft. Bowel sounds are normal. He exhibits no distension and no mass. There is tenderness (RLQ). There is guarding (RLQ). There is no rebound.  Musculoskeletal: Normal range of motion. He exhibits no edema or tenderness.  Neurological: He is alert and oriented to person, place, and time.  Skin: Skin is warm and dry.  Psychiatric: He has a normal mood and affect. His behavior is normal.     Assessment/Plan Acute appendicitis  Start IV Unasyn  Admit to [facility] general surgery service  IV hydration, NPO  Plan OR later this evening for lap appendectomy  Will discuss with my partner, Dr. Estelle Grumbles, who is on call for our group  The risks and benefits of the procedure have been discussed at length with the patient.  The patient understands the proposed procedure, potential alternative treatments, and the course of recovery to be expected.  All of the patient's questions have been answered at this time.  The patient wishes to proceed with surgery.  Velora Heckler, MD, [facility] Office: [phone]    Athol Bolds Judie Petit 03/10/2015, 4:39 PM

## 2015-03-10 NOTE — ED Provider Notes (Signed)
CSN: [id number]     Arrival date & time 03/10/15  1545 History   First MD Initiated Contact with Patient 03/10/15 [id number]     Chief Complaint  Patient presents with  . Abdominal Pain     (Consider location/radiation/quality/duration/timing/severity/associated sxs/prior Treatment) HPI   Patient is a 34 year old male, otherwise healthy, who sent to the ER today from [facility] internal medicine with a CT of the abdomen positive for appendicitis. He had sudden onset periumbilical abdominal pain last night approximately 11 PM, that has moved to his right lower quadrant currently rated 8 out of 10 and associated with loss of appetite,  Chills, N and V.  Last meal was last night, coffee this morning. He currently rates his pain 1/10 and is denying any nausea, vomiting, fever, bowel changes, melena, hematemesis, hematochezia, SOB, CP.   Past Medical History  Diagnosis Date  . Substance abuse    Past Surgical History  Procedure Laterality Date  . Wrist surgery     History reviewed. No pertinent family history. History  Substance Use Topics  . Smoking status: Never Smoker   . Smokeless tobacco: Not on file  . Alcohol Use: No    Review of Systems 10 Systems reviewed and are negative for acute change except as noted in the HPI.      Allergies  Review of patient's allergies indicates no known allergies.  Home Medications   Prior to Admission medications   Medication Sig Start Date End Date Taking? Authorizing Provider  buPROPion (WELLBUTRIN XL) 300 MG 24 hr tablet Take 1 tablet (300 mg total) by mouth daily. For dpression 03/28/14   Sanjuana Kava, NP  hydrOXYzine (ATARAX/VISTARIL) 25 MG tablet Take 1 tablet (25 mg) three times daily as needed: For anxiety 03/28/14   Sanjuana Kava, NP  Melatonin 5 MG TABS Take 1 tablet (5 mg total) by mouth at bedtime. For sleep 03/28/14   Sanjuana Kava, NP  naltrexone (DEPADE) 50 MG tablet Take 1 tablet (50 mg total) by mouth daily as needed (for drinking.).  (Alcohol dependence) 03/28/14   Sanjuana Kava, NP  traZODone (DESYREL) 50 MG tablet Take 1 tablet (50 mg total) by mouth at bedtime and may repeat dose one time if needed. For sleep 03/28/14   Sanjuana Kava, NP   BP 131/84 mmHg  Pulse 86  Temp(Src) 98.1 F (36.7 C) (Oral)  Resp 18  SpO2 98% Physical Exam  Constitutional: He is oriented to person, place, and time. He appears well-developed and well-nourished. No distress.  Patient is a well-appearing male, appears stated age, in NAD, nontoxic appearing  HENT:  Head: Normocephalic and atraumatic.  Nose: Nose normal.  Mouth/Throat: Oropharynx is clear and moist.  Eyes: Conjunctivae and EOM are normal. Pupils are equal, round, and reactive to light. Right eye exhibits no discharge. Left eye exhibits no discharge. No scleral icterus.  Neck: Normal range of motion. No JVD present. No tracheal deviation present. No thyromegaly present.  Cardiovascular: Normal rate, regular rhythm, normal heart sounds and intact distal pulses.  Exam reveals no gallop and no friction rub.   No murmur heard. Symmetrical pulses bilaterally, radial 2+, dorsal pedis 2+  Pulmonary/Chest: Effort normal and breath sounds normal. No respiratory distress. He has no wheezes. He has no rales. He exhibits no tenderness.  Lungs clear to auscultation anteriorly and posteriorly, no wheezes rhonchi or rales  Abdominal: Soft. Bowel sounds are normal. He exhibits no distension and no mass. There is tenderness. There is  rebound and guarding.  Abdomen soft, nondistended, normal bowel sounds 4, tender to palpation periumbilical and right lower quadrant with rebound and guarding  Musculoskeletal: Normal range of motion. He exhibits no edema or tenderness.  Lymphadenopathy:    He has no cervical adenopathy.  Neurological: He is alert and oriented to person, place, and time. He has normal reflexes. No cranial nerve deficit. He exhibits normal muscle tone. Coordination normal.  Skin: Skin  is warm and dry. No rash noted. He is not diaphoretic. No erythema. No pallor.  Psychiatric: He has a normal mood and affect. His behavior is normal. Judgment and thought content normal.  Nursing note and vitals reviewed.   ED Course  Procedures (including critical care time) Labs Review Labs Reviewed  LIPASE, BLOOD  COMPREHENSIVE METABOLIC PANEL  CBC  URINALYSIS, ROUTINE W REFLEX MICROSCOPIC (NOT AT [facility])    Imaging Review Ct Abdomen Pelvis W Contrast  03/10/2015   CLINICAL DATA:  Right lower quadrant pain since last night  EXAM: CT ABDOMEN AND PELVIS WITH CONTRAST  TECHNIQUE: Multidetector CT imaging of the abdomen and pelvis was performed using the standard protocol following bolus administration of intravenous contrast.  CONTRAST:  ISOVUE-300 IOPAMIDOL (ISOVUE-300) INJECTION 61%  COMPARISON:  None.  FINDINGS: Lower chest:  Lung bases are clear.  Normal heart size.  Hepatobiliary: Normal liver.  Normal gallbladder.  Pancreas: Normal.  Spleen: Normal.  Adrenals/Urinary Tract: Normal adrenal glands. Normal kidneys. No obstructive uropathy. Normal bladder.  Stomach/Bowel: No bowel wall thickening. Multiple loops of fluid-filled small bowel and colon. Air-fluid level in the stomach. No pneumatosis, pneumoperitoneum or portal venous gas. No abdominal or pelvic free fluid. Mildly thickened appendix measuring 8.4 mm with mild surrounding hazy inflammatory changes. No periappendiceal fluid collection.  Vascular/Lymphatic: Normal caliber aorta. No abdominal or pelvic lymphadenopathy.  Reproductive: Normal prostate.  Other: No fluid collection or hematoma.  Musculoskeletal: No acute osseous abnormality. No lytic or sclerotic osseous lesion.  IMPRESSION: 1. Findings consistent with acute appendicitis. No focal fluid collection to suggest an abscess or perforation.   Electronically Signed   By: Elige Ko   On: 03/10/2015 15:04     EKG Interpretation None      MDM   Final diagnoses:  None     Patient with acute appendicitis, sent in from PCP, with [facility] aware of pt coming to [facility] ED.   Pt labs and urine obtained.  Pt not complaining of pain, 1/10, states it is tolerable.  Pt will be taken to the OR.  Vitals stable, patient is afebrile, without tachycardia or hypertension, appears comfortable at rest.  Dr. Gerrit Friends at the bedside, will admit for appendectomy.    Danelle Berry, PA-C 03/10/15 1735  Marily Memos, MD 03/12/15 1610

## 2015-03-10 NOTE — ED Notes (Signed)
Pt c/o RLQ abdominal pain with nausea onset last night. CT scan done today, shows acute appendicitis.

## 2015-03-10 NOTE — Op Note (Addendum)
9:52 PM  PATIENT:  Vincent Townsend  34 y.o. male  No care team member to display  PRE-OPERATIVE DIAGNOSIS:  appendicitis  POST-OPERATIVE DIAGNOSIS:  APPENDICITIS  PROCEDURE:  Procedure(s): APPENDECTOMY LAPAROSCOPIC  SURGEON:  Surgeon(s): Mckaylie Vasey, MD  ANESTHESIA:   local and general  EBL:  Total I/O In: 600 [I.V.:600] Out: -   Delay start of Pharmacological VTE agent (>24hrs) due to surgical blood loss or risk of bleeding:  no  DRAINS: none   SPECIMEN:  Source of Specimen:   APPENDIX  DISPOSITION OF SPECIMEN:  PATHOLOGY  COUNTS:  YES  PLAN OF CARE: Admit for overnight observation  PATIENT DISPOSITION:  PACU - hemodynamically stable.   INDICATIONS: Patient with concerning symptoms & work up suspicious for appendicitis.  Surgery was recommended:  The anatomy & physiology of the digestive tract was discussed.  The pathophysiology of appendicitis was discussed.  Natural history risks without surgery was discussed.   I feel the risks of no intervention will lead to serious problems that outweigh the operative risks; therefore, I recommended diagnostic laparoscopy with removal of appendix to remove the pathology.  Laparoscopic & open techniques were discussed.   I noted a good likelihood this will help address the problem.    Risks such as bleeding, infection, abscess, leak, reoperation, possible ostomy, hernia, heart attack, death, and other risks were discussed.  Goals of post-operative recovery were discussed as well.  We will work to minimize complications.  Questions were answered.  The patient expresses understanding & wishes to proceed with surgery.  OR FINDINGS: Retrocecal appendix with early inflammation.  No major purulence or perforation.  No major peritonitis.  Consistent with early nonperforated appendicitis.  DESCRIPTION:   The patient was identified & brought into the operating room. The patient was positioned supine with arms tucked. SCDs were active during  the entire case. The patient underwent general anesthesia without any difficulty.  The abdomen was prepped and draped in a sterile fashion. A Surgical Timeout confirmed our plan.  I made a vertical incision through the umbilicus.  I made a small transverse nick through the infraumbilical fascia and confirmed peritoneal entry.  I placed a [address] [place] CTenny Craw858-LMarHilda [id number] GartTenny Craw(33MarHilda [id number] HeriTenny Craw(85MarHilda 35m3488 North GTenny Craw(671)TayMarHilda 54m1627 LongfelTenny Craw47MarHilda [id number] LauTenny Craw7MarHilda 68m46604 NewTenny Craw408-MarHilda 85m5622Tenny Craw361-SMarHilda 75m4574 East GlTenny Craw2MarHilda 76m8894 [place] Tenny Craw(98MarHilda 66m60478 HTenny Craw65MarHilda 73m8132 PTenny Craw6MarHilda [id number] LivinTenny Craw83MarHilda 59m78723Tenny Craw(814) GMarHilda 51m41586 MaTenny CrawMarHilda [id number] BaTenny Craw6MarHilda [id number] EvergrTenny Craw(641)MarHilda [id number] E. Tenny Craw(785)MarHilda 27m72221 VTenny Craw934-EMarHilda 71m68347 [place] Tenny Craw2MarHilda 56m543 WinterTenny Craw(602)WilMarHilda 7m6646 PrincTenny Craw25MarHilda 67m5015 LakeTenny Craw410MarHilda 73m41979 WaTenny Craw(98MarHilda 17m515 GTenny Craw360-GMarHilda BladesSchleininduced carbon dioxide insufflation.  Camera inspection revealed no injury.  I placed additional ports under direct laparoscopic visualization.  I mobilized the terminal ileum to proximal ascending colon in a lateral to medial fashion.  I took care to avoid injuring any retroperitoneal structures.  I freed the appendix off its attachments to the ascending colon and cecal mesentery.  I elevated the appendix. I skeletonized the mesoappendix. I was able to free off the base of the appendix which was still viable.  I stapled the appendix off the cecum using a laparoscopic stapler. I took a healthy cuff of viable cecum. I ligated the mesoappendix and assured hemostasis in the mesentery.  I removed the appendix out the 12 mm port.  I did copious irrigation. Hemostasis was good in the mesoappendix, colon mesentery, and retroperitoneum. Staple line was intact on the cecum with no bleeding. I washed out the pelvis, retrohepatic space and right paracolic gutter. I washed out the left side as well.  Hemostasis is good. There was no perforation or injury.  Because the area cleaned up well after irrigation, I did not place a drain.  I aspirated the carbon dioxide. I removed the ports. I closed the 12 mm fascia site using a 0 Vicryl stitch. I closed skin using 4-0 monocryl stitch.  Patient  was extubated and sent to the recovery room.  I discussed the operative findings with the patient. I suspect the patient is going used in the hospital at least overnight and will need antibiotics overnight. Questions answered. He expressed understanding and appreciation.  Ardeth Sportsman, M.D., F.A.C.S. Gastrointestinal and Minimally Invasive Surgery [facility] [address] [place], [place] [address] [phone] Main / Paging

## 2015-03-10 NOTE — Anesthesia Preprocedure Evaluation (Signed)
Anesthesia Evaluation  Patient identified by MRN, date of birth, ID band Patient awake    Reviewed: Allergy & Precautions, NPO status , Patient's Chart, lab work & pertinent test results  Airway Mallampati: II  TM Distance: >3 FB Neck ROM: Full    Dental no notable dental hx.    Pulmonary neg pulmonary ROS,  breath sounds clear to auscultation  Pulmonary exam normal       Cardiovascular negative cardio ROS Normal cardiovascular examRhythm:Regular Rate:Normal     Neuro/Psych PSYCHIATRIC DISORDERS Depression negative neurological ROS     GI/Hepatic negative GI ROS, Neg liver ROS,   Endo/Other  negative endocrine ROS  Renal/GU negative Renal ROS  negative genitourinary   Musculoskeletal negative musculoskeletal ROS (+)   Abdominal   Peds negative pediatric ROS (+)  Hematology negative hematology ROS (+)   Anesthesia Other Findings   Reproductive/Obstetrics negative OB ROS                             Anesthesia Physical Anesthesia Plan  ASA: II and emergent  Anesthesia Plan: General   Post-op Pain Management:    Induction: Intravenous  Airway Management Planned: Oral ETT  Additional Equipment:   Intra-op Plan:   Post-operative Plan: Extubation in OR  Informed Consent: I have reviewed the patients History and Physical, chart, labs and discussed the procedure including the risks, benefits and alternatives for the proposed anesthesia with the patient or authorized representative who has indicated his/her understanding and acceptance.   Dental advisory given  Plan Discussed with: CRNA  Anesthesia Plan Comments:         Anesthesia Quick Evaluation

## 2015-03-10 NOTE — Transfer of Care (Signed)
Immediate Anesthesia Transfer of Care Note  Patient: Vincent Townsend  Procedure(s) Performed: Procedure(s): APPENDECTOMY LAPAROSCOPIC (N/A)  Patient Location: PACU  Anesthesia Type:General  Level of Consciousness:  sedated, patient cooperative and responds to stimulation  Airway & Oxygen Therapy:Patient Spontanous Breathing and Patient connected to face mask oxgen  Post-op Assessment:  Report given to PACU RN and Post -op Vital signs reviewed and stable  Post vital signs:  Reviewed and stable  Last Vitals:  Filed Vitals:   03/10/15 2134  BP: 145/55  Pulse: 73  Temp: 36.4 C  Resp: 15    Complications: No apparent anesthesia complications

## 2015-03-10 NOTE — Anesthesia Procedure Notes (Signed)
Procedure Name: Intubation Date/Time: 03/10/2015 8:39 PM Performed by: Early Osmond E Pre-anesthesia Checklist: Patient identified, Emergency Drugs available, Suction available and Patient being monitored Patient Re-evaluated:Patient Re-evaluated prior to inductionOxygen Delivery Method: Circle System Utilized Preoxygenation: Pre-oxygenation with 100% oxygen Intubation Type: IV induction Ventilation: Mask ventilation without difficulty Laryngoscope Size: Mac and 3 Grade View: Grade I Tube type: Oral Tube size: 7.5 mm Number of attempts: 1 Airway Equipment and Method: Stylet Placement Confirmation: ETT inserted through vocal cords under direct vision,  positive ETCO2 and breath sounds checked- equal and bilateral Secured at: 21 cm Tube secured with: Tape Dental Injury: Teeth and Oropharynx as per pre-operative assessment

## 2015-03-10 NOTE — ED Provider Notes (Signed)
Medical screening examination/treatment/procedure(s) were conducted as a shared visit with non-physician practitioner(s) and myself.  I personally evaluated the patient during the encounter.  34 year old male sent over from clinic with CT proven appendicitis. Approximately 24 hours of worsening right lower quadrant right middle quadrant and periumbilical abdominal pain associated with nausea and vomiting. Worse with movement. Exam with significant pain and right lower quadrant to deep palpation with rebound as well. Otherwise appears well vital signs are okay. Labs have been drawn we will consult surgery for further recommendations.   EKG Interpretation None        Marily Memos, MD 03/12/15 [phone]

## 2015-03-11 ENCOUNTER — Encounter (HOSPITAL_COMMUNITY): Payer: Self-pay | Admitting: Surgery

## 2015-03-11 MED ORDER — SODIUM CHLORIDE 0.9 % IV SOLN: 250.0000 mL | INTRAVENOUS | Status: DC | PRN

## 2015-03-11 MED ORDER — ACETAMINOPHEN 325 MG PO TABS: ORAL_TABLET | ORAL | Status: DC

## 2015-03-11 MED ORDER — HYDROXYZINE HCL 25 MG PO TABS: 25.0000 mg | ORAL_TABLET | Freq: Four times a day (QID) | ORAL | Status: DC | PRN

## 2015-03-11 MED ORDER — SODIUM CHLORIDE 0.9 % IV SOLN
8.0000 mg | Freq: Four times a day (QID) | INTRAVENOUS | Status: DC | PRN
  Filled 2015-03-11: qty 4

## 2015-03-11 MED ORDER — DEXTROSE 5 % IV SOLN
1000.0000 mg | Freq: Four times a day (QID) | INTRAVENOUS | Status: DC | PRN
  Filled 2015-03-11: qty 10

## 2015-03-11 MED ORDER — BUPROPION HCL ER (XL) 300 MG PO TB24
300.0000 mg | ORAL_TABLET | Freq: Every day | ORAL | Status: DC
  Administered 2015-03-11: 300 mg via ORAL
  Filled 2015-03-11: qty 1

## 2015-03-11 MED ORDER — SODIUM CHLORIDE 0.9 % IJ SOLN: 3.0000 mL | INTRAMUSCULAR | Status: DC | PRN

## 2015-03-11 MED ORDER — IBUPROFEN 200 MG PO TABS: ORAL_TABLET | ORAL | Status: DC

## 2015-03-11 MED ORDER — KCL IN DEXTROSE-NACL 20-5-0.45 MEQ/L-%-% IV SOLN
INTRAVENOUS | Status: DC
  Administered 2015-03-11: 01:00:00 via INTRAVENOUS
  Filled 2015-03-11 (×2): qty 1000

## 2015-03-11 MED ORDER — SODIUM CHLORIDE 0.9 % IJ SOLN
3.0000 mL | Freq: Two times a day (BID) | INTRAMUSCULAR | Status: DC
  Administered 2015-03-11: 3 mL via INTRAVENOUS

## 2015-03-11 MED ORDER — MELATONIN 5 MG PO TABS: 1.0000 | ORAL_TABLET | Freq: Every day | ORAL | Status: DC

## 2015-03-11 MED ORDER — HYDROCODONE-ACETAMINOPHEN 5-325 MG PO TABS: 1.0000 | ORAL_TABLET | ORAL | Status: DC | PRN

## 2015-03-11 MED ORDER — ONDANSETRON HCL 4 MG/2ML IJ SOLN: 4.0000 mg | Freq: Four times a day (QID) | INTRAMUSCULAR | Status: DC | PRN

## 2015-03-11 MED ORDER — TRAZODONE HCL 50 MG PO TABS
50.0000 mg | ORAL_TABLET | Freq: Every evening | ORAL | Status: DC | PRN
Start: 2015-03-11 — End: 2015-03-11

## 2015-03-11 NOTE — Care Management Note (Addendum)
Case Management Note  Patient Details  Name: Vincent Townsend MRN: [id number] Date of Birth: November 03, 1980  Subjective/Objective:                   APPENDECTOMY LAPAROSCOPIC (N/A) Action/Plan: Discharge planning  Expected Discharge Date:  03/11/15               Expected Discharge Plan:  Home/Self Care  In-House Referral:     Discharge planning Services  CM Consult  Post Acute Care Choice:    Choice offered to:     DME Arranged:    DME Agency:     HH Arranged:    HH Agency:     Status of Service:  Completed, signed off  Medicare Important Message Given:    Date Medicare IM Given:    Medicare IM give by:    Date Additional Medicare IM Given:    Additional Medicare Important Message give by:     If discussed at Long Length of Stay Meetings, dates discussed:    Additional Comments: CM notes pt has no HH recc or ordered.  CM notes no PCP listed in EPIC.  [facility] number placed on pt's AVS to secure a PCP.  No other CM needs were communicated. Yves Dill, RN 03/11/2015, 12:12 PM

## 2015-03-11 NOTE — Progress Notes (Signed)
1 Day Post-Op  Subjective: He is doing OK this Am, sore, and feels gassy.  No nausea, port site dressing dry, Abdomen is soft,but sore.  Objective: Vital signs in last 24 hours: Temp:  [97.6 F (36.4 C)-99.2 F (37.3 C)] 97.8 F (36.6 C) (08/10 0553) Pulse Rate:  [60-90] 80 (08/10 0553) Resp:  [14-18] 18 (08/10 0553) BP: (117-145)/(55-84) 117/58 mmHg (08/10 0553) SpO2:  [96 %-100 %] 99 % (08/10 0553) Last BM Date: 03/10/15 Afebrile, VSS No labs Intake/Output from previous day: 08/09 0701 - 08/10 0700 In: 1660 [P.O.:360; I.V.:1300] Out: 200 [Urine:200] Intake/Output this shift:    General appearance: alert, cooperative and no distress GI: soft, sore, sites all look good.  Lab Results:   Recent Labs  03/10/15 1610  WBC 10.4  HGB 15.5  HCT 45.8  PLT 182    BMET  Recent Labs  03/10/15 1610  NA 137  K 3.9  CL 97*  CO2 28  GLUCOSE 85  BUN 8  CREATININE 0.80  CALCIUM 9.5   PT/INR No results for input(s): LABPROT, INR in the last 72 hours.   Recent Labs Lab 03/10/15 1610  AST 26  ALT 20  ALKPHOS 37*  BILITOT 1.3*  PROT 7.7  ALBUMIN 4.4     Lipase     Component Value Date/Time   LIPASE 14* 03/10/2015 1610     Studies/Results: Ct Abdomen Pelvis W Contrast  03/10/2015   CLINICAL DATA:  Right lower quadrant pain since last night  EXAM: CT ABDOMEN AND PELVIS WITH CONTRAST  TECHNIQUE: Multidetector CT imaging of the abdomen and pelvis was performed using the standard protocol following bolus administration of intravenous contrast.  CONTRAST:  ISOVUE-300 IOPAMIDOL (ISOVUE-300) INJECTION 61%  COMPARISON:  None.  FINDINGS: Lower chest:  Lung bases are clear.  Normal heart size.  Hepatobiliary: Normal liver.  Normal gallbladder.  Pancreas: Normal.  Spleen: Normal.  Adrenals/Urinary Tract: Normal adrenal glands. Normal kidneys. No obstructive uropathy. Normal bladder.  Stomach/Bowel: No bowel wall thickening. Multiple loops of fluid-filled small bowel and  colon. Air-fluid level in the stomach. No pneumatosis, pneumoperitoneum or portal venous gas. No abdominal or pelvic free fluid. Mildly thickened appendix measuring 8.4 mm with mild surrounding hazy inflammatory changes. No periappendiceal fluid collection.  Vascular/Lymphatic: Normal caliber aorta. No abdominal or pelvic lymphadenopathy.  Reproductive: Normal prostate.  Other: No fluid collection or hematoma.  Musculoskeletal: No acute osseous abnormality. No lytic or sclerotic osseous lesion.  IMPRESSION: 1. Findings consistent with acute appendicitis. No focal fluid collection to suggest an abscess or perforation.   Electronically Signed   By: Elige Ko   On: 03/10/2015 15:04    Medications: . buPROPion  300 mg Oral Daily  . cefTRIAXone (ROCEPHIN)  IV  2 g Intravenous Q24H   And  . metronidazole  500 mg Intravenous Q8H  . lip balm  1 application Topical BID  . meperidine      . naproxen  500 mg Oral BID WC  . sodium chloride  3 mL Intravenous Q12H  . traZODone  50 mg Oral QHS,MR X 1    Assessment/Plan Acute appendicitis S/p laparoscopic appendectomy, 03/10/15, Dr. Michaell Cowing Hx of depression Antibiotics:  Rocephin/flagyl started 03/10/15 DVT:  SCD   Plan:  Mobilize, advance diet and home later this AM after he is comfortable with every thing.  No antibiotics.         Lorali Khamis 03/11/2015

## 2015-03-11 NOTE — Progress Notes (Signed)
Discharge instructions discussed until no further questions ask. Pt ambulates with out difficulty, toleratiing food ,voiding qs, and obtaining adequate pain relief with oral medicine. Am assessment unchanged

## 2015-03-11 NOTE — Discharge Summary (Signed)
Physician Discharge Summary [facility]  Patient ID: [name] MRN: [id number] DOB/AGE: Mar 20, 1981 34 y.o.  Admit date: 03/10/2015 Discharge date: 03/11/2015  Admission Diagnoses:  Acute appendicitis  Discharge Diagnoses:  Principal Problem:   Appendicitis, acute Active Problems:   Acute appendicitis   Discharged Condition: good  Hospital Course: Patient was admitted for observation following lap appendectomy.  Post op course was uncomplicated.  Pain was well controlled.  Tolerated diet.  Patient was prepared for discharge home on POD#1.  Consults: None  Treatments: surgery: lap appendectomy  Discharge Exam: Blood pressure 124/75, pulse 73, temperature 98.9 F (37.2 C), temperature source Oral, resp. rate 18, height 5\' 10"  (1.778 m), SpO2 99 %. HEENT - clear Neck - soft Chest - clear bilaterally Cor - RRR Abd - soft without distension; wounds dry and intact  Disposition: Home  Discharge Instructions    Call MD for:  extreme fatigue    Complete by:  As directed      Call MD for:  hives    Complete by:  As directed      Call MD for:  persistant nausea and vomiting    Complete by:  As directed      Call MD for:  redness, tenderness, or signs of infection (pain, swelling, redness, odor or green/yellow discharge around incision site)    Complete by:  As directed      Call MD for:  severe uncontrolled pain    Complete by:  As directed      Call MD for:    Complete by:  As directed   Temperature > 101.8F     Diet - low sodium heart healthy    Complete by:  As directed      Diet - low sodium heart healthy    Complete by:  As directed      Discharge instructions    Complete by:  As directed   Please see discharge instruction sheets.  Also refer to handout given an office.  Please call our office if you have any questions or concerns [phone]     Discharge instructions    Complete by:  As directed   [facility]  LAPAROSCOPIC SURGERY:  POST-OP INSTRUCTIONS  Always review your discharge instruction sheet given to you by the facility where your surgery was performed.  A prescription for pain medication may be given to you upon discharge.  Take your pain medication as prescribed.  If narcotic pain medicine is not needed, then you may take acetaminophen (Tylenol) or ibuprofen (Advil) as needed.  Take your usually prescribed medications unless otherwise directed.  If you need a refill on your pain medication, please contact your pharmacy.  They will contact our office to request authorization. Prescriptions will not be filled after 5 P.M. or on weekends.  You should follow a light diet the first few days after arrival home, such as soup and crackers or toast.  Be sure to include plenty of fluids daily.  Most patients will experience some swelling and bruising in the area of the incisions.  Ice packs will help.  Swelling and bruising can take several days to resolve.   It is common to experience some constipation after surgery.  Increasing fluid intake and taking a stool softener (such as Colace) will usually help or prevent this problem from occurring.  A mild laxative (Milk of Magnesia or Miralax) should be taken according to package instructions if there has been  no bowel movement after 48 hours.  You will have steri-strips and a gauze dressing over your incisions.  You may remove the gauze bandage on the second day after surgery, and you may shower at that time.  Leave your steri-strips (small skin tapes) in place directly over the incision.  These strips should remain on the skin for 5-7 days and then be removed.  You may get them wet in the shower and pat them dry.  Any sutures or staples will be removed at the office during your follow-up visit.  ACTIVITIES:  You may resume regular (light) daily activities beginning the next day - such as daily self-care, walking, climbing stairs - gradually  increasing activities as tolerated.  You may have sexual intercourse when it is comfortable.  Refrain from any heavy lifting or straining until approved by your doctor.  You may drive when you are no longer taking prescription pain medication, you can comfortably wear a seatbelt, and you can safely maneuver your car and apply brakes.  You should see your doctor in the office for a follow-up appointment approximately 2-3 weeks after your surgery.  Make sure that you call for this appointment within a day or two after you arrive home to insure a convenient appointment time.  WHEN TO CALL YOUR DOCTOR: Fever over 101.0 Inability to urinate Continued bleeding from incision Increased pain, redness, or drainage from the incision Increasing abdominal pain  The clinic staff is available to answer your questions during regular business hours.  Please don't hesitate to call and ask to speak to one of the nurses for clinical concerns.  If you have a medical emergency, go to the nearest emergency room or call 911.  A surgeon from [facility] is always on call for the hospital.  Velora Heckler, MD, [facility] Office: [phone] Toll Free:  [phone] FAX [phone]  Website: [email or web address]     Discharge wound care:    Complete by:  As directed   If you have closed incisions, shower and bathe over these incisions with soap and water every day.  Remove all surgical dressings on postoperative day #3.  You do not need to replace dressings over the closed incisions unless you feel more comfortable with a Band-Aid covering it.   If you have an open wound that requires packing, please see wound care instructions.  In general, remove all dressings, wash wound with soap and water and then replace with saline moistened gauze.  Do the dressing change at least every day.  Please call our office [phone] if you have further questions.     Driving  Restrictions    Complete by:  As directed   No driving until off narcotics and can safely swerve away without pain during an emergency     Increase activity slowly    Complete by:  As directed   Walk an hour a day.  Use 20-30 minute walks.  When you can walk 30 minutes without difficulty, it is fine to restart low impact/moderate activities such as biking, jogging, swimming, sexual activity, etc.  Eventually you can increase to unrestricted activity when not feeling pain.  If you feel pain: STOP!Marland Kitchen   Let pain protect you from overdoing it.  Use ice/heat & over-the-counter pain medications to help minimize soreness.  If that is not enough, then use your narcotic pain prescription as needed to remain active.  It is better to take extra pain medications and be more  active than to stay bedridden to avoid all pain medications.     Increase activity slowly    Complete by:  As directed      Lifting restrictions    Complete by:  As directed   Avoid heavy lifting initially.  Do not push through pain.  You have no specific weight limit - if it hurts to do, DON'T DO IT.   If you feel no pain, you are not injuring anything.  Pain will protect you from injury.  Coughing and sneezing are far more stressful to your incision than any lifting.  Avoid resuming heavy lifting / intense activity until off all narcotic pain medications.  When ready to exercise more, give yourself 2 weeks to gradually get back to full intense exercise/activity.     May shower / Bathe    Complete by:  As directed      May walk up steps    Complete by:  As directed      No dressing needed    Complete by:  As directed      Sexual Activity Restrictions    Complete by:  As directed   Sexual activity as tolerated.  Do not push through pain.  Pain will protect you from injury.     Walk with assistance    Complete by:  As directed   Walk over an hour a day.  May use a walker/cane/companion to help with balance and stamina.             Medication List    TAKE these medications        acetaminophen 325 MG tablet  Commonly known as:  TYLENOL  You can take this for pain or Ibuprofen as first line treatment.  Do not take more than 4000 mg of Acetaminophen (Tylenol) per day.     buPROPion 300 MG 24 hr tablet  Commonly known as:  WELLBUTRIN XL  Take 1 tablet (300 mg total) by mouth daily. For dpression     CHOLINE-INOSITOL-METHIONINE-FA PO  Take 1 capsule by mouth daily.     Fish Oil 1000 MG Caps  Take 3,000 capsules by mouth daily.     HYDROcodone-acetaminophen 5-325 MG per tablet  Commonly known as:  NORCO/VICODIN  Take 1-2 tablets by mouth every 4 (four) hours as needed for moderate pain.     HYDROcodone-acetaminophen 5-325 MG per tablet  Commonly known as:  NORCO/VICODIN  Take 1-2 tablets by mouth every 4 (four) hours as needed for moderate pain.     hydrOXYzine 25 MG tablet  Commonly known as:  ATARAX/VISTARIL  Take 1 tablet (25 mg) three times daily as needed: For anxiety     ibuprofen 200 MG tablet  Commonly known as:  ADVIL  You can take 2-3 tablets every 6 hours as needed for pain.     Melatonin 5 MG Tabs  Take 1 tablet (5 mg total) by mouth at bedtime. For sleep     MULTIVITAMIN & MINERAL PO  Take 1 tablet by mouth daily.     naltrexone 50 MG tablet  Commonly known as:  DEPADE  Take 1 tablet (50 mg total) by mouth daily as needed (for drinking.). (Alcohol dependence)     traZODone 50 MG tablet  Commonly known as:  DESYREL  Take 1 tablet (50 mg total) by mouth at bedtime and may repeat dose one time if needed. For sleep     Vitamin D (Cholecalciferol) 1000 UNITS Caps  Take 1  capsule by mouth daily.           Follow-up Information    Follow up with [facility] [place] On 03/31/2015.   Why:  I have ask the office to put you in on this date at 3:30 PM, you need to be there 30 minutes early for check in.  If you don't hear from the office in a couple days, call and make sure the appointment was  confirmed.   Contact information:   [address] [place] [address] [phone]      Follow up with [facility].   Why:  Call this number to secure a primary care physician or look on the back of your [employer] insurance card and call for an in-network primary care physician if you do not have one.   Contact information:   [phone]      Follow up with [facility] [place]. Schedule an appointment as soon as possible for a visit in 2 weeks.   Why:  For wound re-check   Contact information:   [address] [place] [address] [phone]      Velora Heckler, MD, [facility] Office: [phone]   Signed: Velora Heckler 03/11/2015, 2:56 PM

## 2015-03-11 NOTE — Anesthesia Postprocedure Evaluation (Signed)
  Anesthesia Post-op Note  Patient: Vincent Townsend  Procedure(s) Performed: Procedure(s) (LRB): APPENDECTOMY LAPAROSCOPIC (N/A)  Patient Location: PACU  Anesthesia Type: General  Level of Consciousness: awake and alert   Airway and Oxygen Therapy: Patient Spontanous Breathing  Post-op Pain: mild  Post-op Assessment: Post-op Vital signs reviewed, Patient's Cardiovascular Status Stable, Respiratory Function Stable, Patent Airway and No signs of Nausea or vomiting  Last Vitals:  Filed Vitals:   03/11/15 0200  BP: 125/59  Pulse: 72  Temp: 36.7 C  Resp: 18    Post-op Vital Signs: stable   Complications: No apparent anesthesia complications

## 2019-02-26 ENCOUNTER — Ambulatory Visit
Admission: RE | Admit: 2019-02-26 | Discharge: 2019-02-26 | Disposition: A | Discharge status: Home / Self Care | Payer: BC Managed Care – PPO | Source: Ambulatory Visit | Attending: Internal Medicine | Admitting: Internal Medicine

## 2019-02-26 ENCOUNTER — Other Ambulatory Visit: Payer: Self-pay | Admitting: Internal Medicine

## 2019-02-26 DIAGNOSIS — M545 Low back pain, unspecified: Secondary | ICD-10-CM

## 2019-02-26 DIAGNOSIS — M549 Dorsalgia, unspecified: Secondary | ICD-10-CM | POA: Diagnosis not present

## 2019-02-26 DIAGNOSIS — R109 Unspecified abdominal pain: Secondary | ICD-10-CM | POA: Diagnosis not present

## 2020-07-26 DIAGNOSIS — Z03818 Encounter for observation for suspected exposure to other biological agents ruled out: Secondary | ICD-10-CM | POA: Diagnosis not present

## 2020-08-18 DIAGNOSIS — Z1152 Encounter for screening for COVID-19: Secondary | ICD-10-CM | POA: Diagnosis not present

## 2020-08-21 DIAGNOSIS — Z1152 Encounter for screening for COVID-19: Secondary | ICD-10-CM | POA: Diagnosis not present

## 2020-10-23 DIAGNOSIS — Z7289 Other problems related to lifestyle: Secondary | ICD-10-CM | POA: Diagnosis not present

## 2020-10-23 DIAGNOSIS — Z1159 Encounter for screening for other viral diseases: Secondary | ICD-10-CM | POA: Diagnosis not present

## 2020-10-23 DIAGNOSIS — L720 Epidermal cyst: Secondary | ICD-10-CM | POA: Diagnosis not present

## 2020-10-23 DIAGNOSIS — Z0001 Encounter for general adult medical examination with abnormal findings: Secondary | ICD-10-CM | POA: Diagnosis not present

## 2020-10-23 DIAGNOSIS — E7800 Pure hypercholesterolemia, unspecified: Secondary | ICD-10-CM | POA: Diagnosis not present

## 2021-02-21 DIAGNOSIS — S01511A Laceration without foreign body of lip, initial encounter: Secondary | ICD-10-CM | POA: Diagnosis not present

## 2021-07-29 DIAGNOSIS — R197 Diarrhea, unspecified: Secondary | ICD-10-CM | POA: Diagnosis not present

## 2021-07-29 DIAGNOSIS — Z03818 Encounter for observation for suspected exposure to other biological agents ruled out: Secondary | ICD-10-CM | POA: Diagnosis not present

## 2021-10-20 DIAGNOSIS — F332 Major depressive disorder, recurrent severe without psychotic features: Secondary | ICD-10-CM | POA: Diagnosis not present

## 2021-11-03 ENCOUNTER — Encounter (HOSPITAL_COMMUNITY): Payer: Self-pay

## 2021-11-03 ENCOUNTER — Emergency Department (HOSPITAL_COMMUNITY)
Admission: EM | Admit: 2021-11-03 | Discharge: 2021-11-03 | Disposition: A | Discharge status: Home / Self Care | Payer: BC Managed Care – PPO | Attending: Emergency Medicine | Admitting: Emergency Medicine

## 2021-11-03 ENCOUNTER — Emergency Department (HOSPITAL_COMMUNITY): Payer: BC Managed Care – PPO

## 2021-11-03 ENCOUNTER — Other Ambulatory Visit: Payer: Self-pay

## 2021-11-03 DIAGNOSIS — R0789 Other chest pain: Secondary | ICD-10-CM | POA: Diagnosis not present

## 2021-11-03 DIAGNOSIS — R079 Chest pain, unspecified: Secondary | ICD-10-CM | POA: Diagnosis not present

## 2021-11-03 DIAGNOSIS — R7309 Other abnormal glucose: Secondary | ICD-10-CM | POA: Insufficient documentation

## 2021-11-03 DIAGNOSIS — R42 Dizziness and giddiness: Secondary | ICD-10-CM | POA: Insufficient documentation

## 2021-11-03 LAB — CBC
HCT: 40.5 % (ref 39.0–52.0)
Hemoglobin: 13.5 g/dL (ref 13.0–17.0)
MCH: 31.4 pg (ref 26.0–34.0)
MCHC: 33.3 g/dL (ref 30.0–36.0)
MCV: 94.2 fL (ref 80.0–100.0)
Platelets: 267 10*3/uL (ref 150–400)
RBC: 4.3 MIL/uL (ref 4.22–5.81)
RDW: 13 % (ref 11.5–15.5)
WBC: 7.7 10*3/uL (ref 4.0–10.5)
nRBC: 0 % (ref 0.0–0.2)

## 2021-11-03 LAB — BASIC METABOLIC PANEL
Anion gap: 9 (ref 5–15)
BUN: 6 mg/dL (ref 6–20)
CO2: 28 mmol/L (ref 22–32)
Calcium: 9.2 mg/dL (ref 8.9–10.3)
Chloride: 99 mmol/L (ref 98–111)
Creatinine, Ser: 0.76 mg/dL (ref 0.61–1.24)
GFR, Estimated: >60 mL/min (ref >60)
Glucose, Bld: 128 mg/dL — ABNORMAL HIGH (ref 70–99)
Potassium: 3.3 mmol/L — ABNORMAL LOW (ref 3.5–5.1)
Sodium: 136 mmol/L (ref 135–145)

## 2021-11-03 LAB — TROPONIN I (HIGH SENSITIVITY)
Troponin I (High Sensitivity): 3 ng/L (ref <18)
Troponin I (High Sensitivity): 3 ng/L (ref <18)

## 2021-11-03 NOTE — ED Triage Notes (Signed)
Pt arrived POV c/o left sided CP that started right after lunch today. The pain does not radiate anywhere. Pt endorses SHOB and nausea with the pain.  ?

## 2021-11-03 NOTE — ED Notes (Signed)
RN reviewed discharge instructions with pt. Pt verbalized understanding and had no further questions. VSS upon discharge.  

## 2021-11-03 NOTE — Discharge Instructions (Addendum)
Based on our discussion and your work-up today I have minimal concern for cardiac cause of your chest pain today.  I think that your suspicion that this may have been due to a panic attack is reasonable.  I recommend that you follow-up with your primary care doctor within the next 1 to 2 weeks to ensure that your symptoms are fully resolved.  Please return to the emergency department if you have persistent or worsening chest pain, shortness of breath. ?

## 2021-11-03 NOTE — ED Provider Triage Note (Signed)
Emergency Medicine Provider Triage Evaluation Note ? ?Vincent Townsend , a 41 y.o. male  was evaluated in triage.  Pt complains of left-sided chest pain that began after lunch today.  Does not radiate anywhere.  No alleviating or exacerbating factors.  Did not take any medication for improvement in his symptoms.  Prior episode in the past remarkable for constipation with similar etiology in symptoms.  No prior history of CAD, no family history of CAD, non-smoker. ?Recently diagnosed with prehypertension, currently on no BP meds. ? ?Review of Systems  ?Positive: Chest pain ?Negative: Sob, fever, abdominal pain ? ?Physical Exam  ?BP (!) 147/103 (BP Location: Right Arm)   Pulse 98   Temp 97.7 ?F (36.5 ?C)   Resp 17   SpO2 100%  ?Gen:   Awake, no distress   ?Resp:  Normal effort  ?MSK:   Moves extremities without difficulty  ?Other:   ? ?Medical Decision Making  ?Medically screening exam initiated at 2:21 PM.  Appropriate orders placed.  Charvez Voorhies was informed that the remainder of the evaluation will be completed by another provider, this initial triage assessment does not replace that evaluation, and the importance of remaining in the ED until their evaluation is complete. ? ? ?  ?Claude Manges, PA-C ?11/03/21 1431 ? ?

## 2021-11-03 NOTE — ED Provider Notes (Signed)
?[facility] ?Provider Note ? ? ?CSN: [id number] ?Arrival date & time: 11/03/21  1402 ? ?  ? ?History ? ?Chief Complaint  ?Patient presents with  ? Chest Pain  ? ? ?Vincent Townsend is a 41 y.o. male with a past medical history significant for Depression, previous alcohol abuse who presents with concern for left-sided chest pain, chest pressure that began earlier today.  Patient reports that it was a 7/10 pain, felt like pressure.  He denies exertional symptoms.  He does report that his hands and feet were quite clammy at the time.  He reports that he felt somewhat lightheaded when he stood up.  He reports that his watch had noted his heart rate to be around 135 at maximum.  He reports that he has been alcohol free for many years, but he does still struggle with anxiety, depression, and was concerned that this may have been a panic attack.  Patient reports some increased life stress recently.  He denies history of hypertension, diabetes, high cholesterol, strong family history of coronary artery disease in first-degree family member, history of stroke, personal history of ACS.  He does not smoke tobacco at this time.  At this time patient reports that chest pain is mostly resolved, he does endorse some slight discomfort in the central chest that is around a 2/10 in severity.  Patient denies any estrogen use, recent travel, history of blood clots. ? ? ?Chest Pain ? ?  ? ?Home Medications ?Prior to Admission medications   ?Medication Sig Start Date End Date Taking? Authorizing Provider  ?acetaminophen (TYLENOL) 325 MG tablet You can take this for pain or Ibuprofen as first line treatment.  Do not take more than 4000 mg of Acetaminophen (Tylenol) per day. 03/11/15   Earnstine Regal, PA-C  ?buPROPion (WELLBUTRIN XL) 300 MG 24 hr tablet Take 1 tablet (300 mg total) by mouth daily. For dpression 03/28/14   Lindell Spar I, NP  ?CHOLINE-INOSITOL-METHIONINE-FA PO Take 1 capsule by mouth daily.     [provider]  ?HYDROcodone-acetaminophen (NORCO/VICODIN) 5-325 MG per tablet Take 1-2 tablets by mouth every 4 (four) hours as needed for moderate pain. 03/11/15   Earnstine Regal, PA-C  ?HYDROcodone-acetaminophen (NORCO/VICODIN) 5-325 MG per tablet Take 1-2 tablets by mouth every 4 (four) hours as needed for moderate pain. 03/11/15   Armandina Gemma, MD  ?hydrOXYzine (ATARAX/VISTARIL) 25 MG tablet Take 1 tablet (25 mg) three times daily as needed: For anxiety ?Patient not taking: Reported on 03/10/2015 03/28/14   Lindell Spar I, NP  ?ibuprofen (ADVIL) 200 MG tablet You can take 2-3 tablets every 6 hours as needed for pain. 03/11/15   Earnstine Regal, PA-C  ?Melatonin 5 MG TABS Take 1 tablet (5 mg total) by mouth at bedtime. For sleep 03/28/14   Lindell Spar I, NP  ?Multiple Vitamins-Minerals (MULTIVITAMIN & MINERAL PO) Take 1 tablet by mouth daily.    [provider]  ?naltrexone (DEPADE) 50 MG tablet Take 1 tablet (50 mg total) by mouth daily as needed (for drinking.). (Alcohol dependence) 03/28/14   Encarnacion Slates, NP  ?Omega-3 Fatty Acids (FISH OIL) 1000 MG CAPS Take 3,000 capsules by mouth daily.    [provider]  ?traZODone (DESYREL) 50 MG tablet Take 1 tablet (50 mg total) by mouth at bedtime and may repeat dose one time if needed. For sleep ?Patient not taking: Reported on 03/10/2015 03/28/14   Encarnacion Slates, NP  ?Vitamin D, Cholecalciferol, 1000 UNITS CAPS Take  1 capsule by mouth daily.    [provider]  ?   ? ?Allergies    ?Patient has no known allergies.   ? ?Review of Systems   ?Review of Systems  ?Cardiovascular:  Positive for chest pain.  ?Psychiatric/Behavioral:  The patient is nervous/anxious.   ?All other systems reviewed and are negative. ? ?Physical Exam ?Updated Vital Signs ?BP 120/81   Pulse 66   Temp 97.7 ?F (36.5 ?C)   Resp 14   Ht 5\' 10"  (1.778 m)   Wt 67.1 kg   SpO2 98%   BMI 21.24 kg/m?  ?Physical Exam ?Vitals and nursing note reviewed.   ?Constitutional:   ?   General: He is not in acute distress. ?   Appearance: Normal appearance.  ?HENT:  ?   Head: Normocephalic and atraumatic.  ?Eyes:  ?   General:     ?   Right eye: No discharge.     ?   Left eye: No discharge.  ?Cardiovascular:  ?   Rate and Rhythm: Normal rate and regular rhythm.  ?   Heart sounds: No murmur heard. ?  No friction rub. No gallop.  ?Pulmonary:  ?   Effort: Pulmonary effort is normal.  ?   Breath sounds: Normal breath sounds.  ?Abdominal:  ?   General: Bowel sounds are normal.  ?   Palpations: Abdomen is soft.  ?Skin: ?   General: Skin is warm and dry.  ?   Capillary Refill: Capillary refill takes less than 2 seconds.  ?Neurological:  ?   Mental Status: He is alert and oriented to person, place, and time.  ?Psychiatric:     ?   Mood and Affect: Mood normal.     ?   Behavior: Behavior normal.  ? ? ?ED Results / Procedures / Treatments   ?Labs ?(all labs ordered are listed, but only abnormal results are displayed) ?Labs Reviewed  ?BASIC METABOLIC PANEL - Abnormal; Notable for the following components:  ?    Result Value  ? Potassium 3.3 (*)   ? Glucose, Bld 128 (*)   ? All other components within normal limits  ?CBC  ?TROPONIN I (HIGH SENSITIVITY)  ?TROPONIN I (HIGH SENSITIVITY)  ? ? ?EKG ?EKG Interpretation ? ?Date/Time:  Wednesday November 03 2021 14:09:43 EDT ?Ventricular Rate:  88 ?PR Interval:  140 ?QRS Duration: 82 ?QT Interval:  360 ?QTC Calculation: 435 ?R Axis:   82 ?Text Interpretation: Normal sinus rhythm Nonspecific T wave abnormality Abnormal ECG No previous ECGs available Confirmed by Nanda Quinton [phone]) on 11/03/2021 6:06:47 PM ? ?Radiology ?DG Chest 1 View ? ?Result Date: 11/03/2021 ?CLINICAL DATA:  Left-sided chest pain EXAM: CHEST  1 VIEW COMPARISON:  None. FINDINGS: The cardiomediastinal silhouette is within normal limits. There is no focal airspace consolidation. There is no pleural effusion. No pneumothorax. No acute osseous abnormality. Possible old left mid  clavicle injury. IMPRESSION: No evidence of acute cardiopulmonary disease. Electronically Signed   By: Maurine Simmering M.D.   On: 11/03/2021 14:57   ? ?Procedures ?Procedures  ? ? ?Medications Ordered in ED ?Medications - No data to display ? ?ED Course/ Medical Decision Making/ A&P ?  ?                        ?Medical Decision Making ? ?Given the large differential diagnosis for Vincent Townsend, the decision making in this case is of high complexity. ? ?After evaluating all of the  data points in this case, the presentation of Vincent Townsend is NOT consistent with Acute Coronary Syndrome (ACS) and/or myocardial ischemia, pulmonary embolism, aortic dissection; Borhaave's, significant arrythmia, pneumothorax, cardiac tamponade, or other emergent cardiopulmonary condition. ? ?Further, the presentation of Vincent Townsend is NOT consistent with pericarditis, myocarditis, cholecystitis, pancreatitis, mediastinitis, endocarditis, new valvular disease. ? ?Additionally, the presentation of Vincent Townsend NOT consistent with flail chest, cardiac contusion, ARDS, or significant intra-thoracic or intra-abdominal bleeding. ? ?Moreover, this presentation is NOT consistent with pneumonia, sepsis, or pyelonephritis. ? ?The patient has a   negative troponin x 2, overall unremarkable labwork, mild hypokalemia 3.3, encouraged increased oral intake.  Mildly elevated glucose at 120.  Unremarkable CBC.  I personally ordered and independently reviewed plain film radiograph the chest which shows no evidence of intrathoracic abnormality. ? ?My attending physician Dr. Laverta Baltimore independently interpreted an EKG Which shows normal sinus rhythm with nonspecific T wave abnormality. Patient with a heart score of 2.  He is PERC negative with no evidence of pulmonary embolism.  Discussed with patient that his presentation is most consistent with a panic attack, but also discussed musculoskeletal, acid reflux, pleurisy, other causes of chest pain.  Encourage  follow-up with his PCP in 1 to 2 weeks for further evaluation, cholesterol evaluation.   ? ?Considered admission for further observation, however patient does not meet criteria by heart score, appears stable, with negative cardia

## 2021-11-22 ENCOUNTER — Other Ambulatory Visit: Payer: Self-pay | Admitting: Physician Assistant

## 2021-11-22 DIAGNOSIS — F1011 Alcohol abuse, in remission: Secondary | ICD-10-CM

## 2021-11-22 DIAGNOSIS — E7800 Pure hypercholesterolemia, unspecified: Secondary | ICD-10-CM

## 2021-11-22 DIAGNOSIS — Z0000 Encounter for general adult medical examination without abnormal findings: Secondary | ICD-10-CM | POA: Diagnosis not present

## 2021-11-22 DIAGNOSIS — Z125 Encounter for screening for malignant neoplasm of prostate: Secondary | ICD-10-CM | POA: Diagnosis not present

## 2021-12-01 DIAGNOSIS — F332 Major depressive disorder, recurrent severe without psychotic features: Secondary | ICD-10-CM | POA: Diagnosis not present

## 2021-12-02 DIAGNOSIS — F332 Major depressive disorder, recurrent severe without psychotic features: Secondary | ICD-10-CM | POA: Diagnosis not present

## 2021-12-03 DIAGNOSIS — F332 Major depressive disorder, recurrent severe without psychotic features: Secondary | ICD-10-CM | POA: Diagnosis not present

## 2021-12-06 DIAGNOSIS — F332 Major depressive disorder, recurrent severe without psychotic features: Secondary | ICD-10-CM | POA: Diagnosis not present

## 2021-12-07 DIAGNOSIS — F332 Major depressive disorder, recurrent severe without psychotic features: Secondary | ICD-10-CM | POA: Diagnosis not present

## 2021-12-08 DIAGNOSIS — R739 Hyperglycemia, unspecified: Secondary | ICD-10-CM | POA: Diagnosis not present

## 2021-12-08 DIAGNOSIS — F332 Major depressive disorder, recurrent severe without psychotic features: Secondary | ICD-10-CM | POA: Diagnosis not present

## 2021-12-08 DIAGNOSIS — R7301 Impaired fasting glucose: Secondary | ICD-10-CM | POA: Diagnosis not present

## 2021-12-09 DIAGNOSIS — F332 Major depressive disorder, recurrent severe without psychotic features: Secondary | ICD-10-CM | POA: Diagnosis not present

## 2021-12-10 DIAGNOSIS — F332 Major depressive disorder, recurrent severe without psychotic features: Secondary | ICD-10-CM | POA: Diagnosis not present

## 2021-12-14 DIAGNOSIS — F332 Major depressive disorder, recurrent severe without psychotic features: Secondary | ICD-10-CM | POA: Diagnosis not present

## 2021-12-15 DIAGNOSIS — F332 Major depressive disorder, recurrent severe without psychotic features: Secondary | ICD-10-CM | POA: Diagnosis not present

## 2021-12-16 DIAGNOSIS — F332 Major depressive disorder, recurrent severe without psychotic features: Secondary | ICD-10-CM | POA: Diagnosis not present

## 2021-12-23 ENCOUNTER — Other Ambulatory Visit: Payer: BC Managed Care – PPO

## 2021-12-28 DIAGNOSIS — D229 Melanocytic nevi, unspecified: Secondary | ICD-10-CM | POA: Diagnosis not present

## 2021-12-28 DIAGNOSIS — D225 Melanocytic nevi of trunk: Secondary | ICD-10-CM | POA: Diagnosis not present

## 2021-12-28 DIAGNOSIS — D1801 Hemangioma of skin and subcutaneous tissue: Secondary | ICD-10-CM | POA: Diagnosis not present

## 2021-12-28 DIAGNOSIS — F332 Major depressive disorder, recurrent severe without psychotic features: Secondary | ICD-10-CM | POA: Diagnosis not present

## 2021-12-28 DIAGNOSIS — L578 Other skin changes due to chronic exposure to nonionizing radiation: Secondary | ICD-10-CM | POA: Diagnosis not present

## 2021-12-28 DIAGNOSIS — L814 Other melanin hyperpigmentation: Secondary | ICD-10-CM | POA: Diagnosis not present

## 2021-12-30 DIAGNOSIS — F332 Major depressive disorder, recurrent severe without psychotic features: Secondary | ICD-10-CM | POA: Diagnosis not present

## 2021-12-31 DIAGNOSIS — F332 Major depressive disorder, recurrent severe without psychotic features: Secondary | ICD-10-CM | POA: Diagnosis not present

## 2022-01-03 DIAGNOSIS — F332 Major depressive disorder, recurrent severe without psychotic features: Secondary | ICD-10-CM | POA: Diagnosis not present

## 2022-01-05 DIAGNOSIS — F332 Major depressive disorder, recurrent severe without psychotic features: Secondary | ICD-10-CM | POA: Diagnosis not present

## 2022-01-06 DIAGNOSIS — F332 Major depressive disorder, recurrent severe without psychotic features: Secondary | ICD-10-CM | POA: Diagnosis not present

## 2022-01-07 DIAGNOSIS — F332 Major depressive disorder, recurrent severe without psychotic features: Secondary | ICD-10-CM | POA: Diagnosis not present

## 2022-01-10 DIAGNOSIS — F332 Major depressive disorder, recurrent severe without psychotic features: Secondary | ICD-10-CM | POA: Diagnosis not present

## 2022-01-11 DIAGNOSIS — F332 Major depressive disorder, recurrent severe without psychotic features: Secondary | ICD-10-CM | POA: Diagnosis not present

## 2022-01-12 DIAGNOSIS — F332 Major depressive disorder, recurrent severe without psychotic features: Secondary | ICD-10-CM | POA: Diagnosis not present

## 2022-01-13 DIAGNOSIS — F332 Major depressive disorder, recurrent severe without psychotic features: Secondary | ICD-10-CM | POA: Diagnosis not present

## 2022-01-14 DIAGNOSIS — F332 Major depressive disorder, recurrent severe without psychotic features: Secondary | ICD-10-CM | POA: Diagnosis not present

## 2022-01-17 DIAGNOSIS — F332 Major depressive disorder, recurrent severe without psychotic features: Secondary | ICD-10-CM | POA: Diagnosis not present

## 2022-01-18 DIAGNOSIS — F332 Major depressive disorder, recurrent severe without psychotic features: Secondary | ICD-10-CM | POA: Diagnosis not present

## 2022-01-19 DIAGNOSIS — F332 Major depressive disorder, recurrent severe without psychotic features: Secondary | ICD-10-CM | POA: Diagnosis not present

## 2022-01-20 DIAGNOSIS — F332 Major depressive disorder, recurrent severe without psychotic features: Secondary | ICD-10-CM | POA: Diagnosis not present

## 2022-01-21 DIAGNOSIS — F332 Major depressive disorder, recurrent severe without psychotic features: Secondary | ICD-10-CM | POA: Diagnosis not present

## 2022-01-24 DIAGNOSIS — F332 Major depressive disorder, recurrent severe without psychotic features: Secondary | ICD-10-CM | POA: Diagnosis not present

## 2022-01-25 DIAGNOSIS — F332 Major depressive disorder, recurrent severe without psychotic features: Secondary | ICD-10-CM | POA: Diagnosis not present

## 2022-01-26 ENCOUNTER — Other Ambulatory Visit: Payer: BC Managed Care – PPO

## 2022-01-26 ENCOUNTER — Ambulatory Visit
Admission: RE | Admit: 2022-01-26 | Discharge: 2022-01-26 | Disposition: A | Discharge status: Home / Self Care | Payer: No Typology Code available for payment source | Source: Ambulatory Visit | Attending: Physician Assistant | Admitting: Physician Assistant

## 2022-01-26 DIAGNOSIS — E7800 Pure hypercholesterolemia, unspecified: Secondary | ICD-10-CM

## 2022-01-26 DIAGNOSIS — F332 Major depressive disorder, recurrent severe without psychotic features: Secondary | ICD-10-CM | POA: Diagnosis not present

## 2022-01-26 DIAGNOSIS — F1011 Alcohol abuse, in remission: Secondary | ICD-10-CM

## 2022-01-27 DIAGNOSIS — F332 Major depressive disorder, recurrent severe without psychotic features: Secondary | ICD-10-CM | POA: Diagnosis not present

## 2022-01-28 DIAGNOSIS — F332 Major depressive disorder, recurrent severe without psychotic features: Secondary | ICD-10-CM | POA: Diagnosis not present

## 2022-01-31 DIAGNOSIS — F332 Major depressive disorder, recurrent severe without psychotic features: Secondary | ICD-10-CM | POA: Diagnosis not present

## 2022-02-02 DIAGNOSIS — F332 Major depressive disorder, recurrent severe without psychotic features: Secondary | ICD-10-CM | POA: Diagnosis not present

## 2022-02-03 DIAGNOSIS — F332 Major depressive disorder, recurrent severe without psychotic features: Secondary | ICD-10-CM | POA: Diagnosis not present

## 2022-03-16 DIAGNOSIS — F332 Major depressive disorder, recurrent severe without psychotic features: Secondary | ICD-10-CM | POA: Diagnosis not present

## 2022-03-18 DIAGNOSIS — Z01818 Encounter for other preprocedural examination: Secondary | ICD-10-CM | POA: Diagnosis not present

## 2022-03-30 DIAGNOSIS — R7303 Prediabetes: Secondary | ICD-10-CM | POA: Diagnosis not present

## 2022-03-30 DIAGNOSIS — R7989 Other specified abnormal findings of blood chemistry: Secondary | ICD-10-CM | POA: Diagnosis not present

## 2022-05-11 DIAGNOSIS — K648 Other hemorrhoids: Secondary | ICD-10-CM | POA: Diagnosis not present

## 2022-05-11 DIAGNOSIS — K635 Polyp of colon: Secondary | ICD-10-CM | POA: Diagnosis not present

## 2022-05-11 DIAGNOSIS — Q438 Other specified congenital malformations of intestine: Secondary | ICD-10-CM | POA: Diagnosis not present

## 2022-05-11 DIAGNOSIS — Z1211 Encounter for screening for malignant neoplasm of colon: Secondary | ICD-10-CM | POA: Diagnosis not present

## 2022-11-24 DIAGNOSIS — E7800 Pure hypercholesterolemia, unspecified: Secondary | ICD-10-CM | POA: Diagnosis not present

## 2022-11-24 DIAGNOSIS — R6882 Decreased libido: Secondary | ICD-10-CM | POA: Diagnosis not present

## 2022-11-24 DIAGNOSIS — Z0000 Encounter for general adult medical examination without abnormal findings: Secondary | ICD-10-CM | POA: Diagnosis not present

## 2022-11-24 DIAGNOSIS — Z125 Encounter for screening for malignant neoplasm of prostate: Secondary | ICD-10-CM | POA: Diagnosis not present

## 2022-11-24 DIAGNOSIS — R7309 Other abnormal glucose: Secondary | ICD-10-CM | POA: Diagnosis not present

## 2023-01-12 DIAGNOSIS — L814 Other melanin hyperpigmentation: Secondary | ICD-10-CM | POA: Diagnosis not present

## 2023-01-12 DIAGNOSIS — D229 Melanocytic nevi, unspecified: Secondary | ICD-10-CM | POA: Diagnosis not present

## 2023-01-12 DIAGNOSIS — L578 Other skin changes due to chronic exposure to nonionizing radiation: Secondary | ICD-10-CM | POA: Diagnosis not present

## 2023-01-12 DIAGNOSIS — D1801 Hemangioma of skin and subcutaneous tissue: Secondary | ICD-10-CM | POA: Diagnosis not present

## 2023-07-26 ENCOUNTER — Emergency Department (HOSPITAL_BASED_OUTPATIENT_CLINIC_OR_DEPARTMENT_OTHER)
Admission: EM | Admit: 2023-07-26 | Discharge: 2023-07-26 | Disposition: A | Discharge status: Home / Self Care | Payer: BC Managed Care – PPO | Attending: Emergency Medicine | Admitting: Emergency Medicine

## 2023-07-26 ENCOUNTER — Encounter (HOSPITAL_BASED_OUTPATIENT_CLINIC_OR_DEPARTMENT_OTHER): Payer: Self-pay | Admitting: Emergency Medicine

## 2023-07-26 DIAGNOSIS — X501XXA Overexertion from prolonged static or awkward postures, initial encounter: Secondary | ICD-10-CM | POA: Insufficient documentation

## 2023-07-26 DIAGNOSIS — S81811A Laceration without foreign body, right lower leg, initial encounter: Secondary | ICD-10-CM | POA: Diagnosis not present

## 2023-07-26 DIAGNOSIS — M79661 Pain in right lower leg: Secondary | ICD-10-CM | POA: Diagnosis not present

## 2023-07-26 DIAGNOSIS — S86111A Strain of other muscle(s) and tendon(s) of posterior muscle group at lower leg level, right leg, initial encounter: Secondary | ICD-10-CM | POA: Diagnosis not present

## 2023-07-26 DIAGNOSIS — Y9366 Activity, soccer: Secondary | ICD-10-CM | POA: Insufficient documentation

## 2023-07-26 MED ORDER — ACETAMINOPHEN 500 MG PO TABS
1000.0000 mg | ORAL_TABLET | Freq: Once | ORAL | Status: AC
  Administered 2023-07-26: 1000 mg via ORAL
  Filled 2023-07-26: qty 2

## 2023-07-26 MED ORDER — KETOROLAC TROMETHAMINE 60 MG/2ML IM SOLN
30.0000 mg | Freq: Once | INTRAMUSCULAR | Status: AC
  Administered 2023-07-26: 30 mg via INTRAMUSCULAR
  Filled 2023-07-26: qty 2

## 2023-07-26 NOTE — ED Provider Notes (Signed)
[place] [facility] Provider Note   CSN: [id number] Arrival date & time: 07/26/23  1504     History  Chief Complaint  Patient presents with   Leg Pain    Vincent Townsend is a 42 y.o. male no significant past medical history who presents with R calf pain.   Pt was playing soccer today and when he jumped off on his right leg, he heard a pop and immediate pain to right calf; can bear weight, but is very painful.  Brother-in-law recently tore his Achille's tendon and so was very concerned that might be it.  No history of similar pain.  No other complaints.      Home Medications Prior to Admission medications   Medication Sig Start Date End Date Taking? Authorizing Provider  acetaminophen (TYLENOL) 325 MG tablet You can take this for pain or Ibuprofen as first line treatment.  Do not take more than 4000 mg of Acetaminophen (Tylenol) per day. 03/11/15   Sherrie George, PA-C  buPROPion (WELLBUTRIN XL) 300 MG 24 hr tablet Take 1 tablet (300 mg total) by mouth daily. For dpression 03/28/14   Armandina Stammer I, NP  CHOLINE-INOSITOL-METHIONINE-FA PO Take 1 capsule by mouth daily.    [provider]  HYDROcodone-acetaminophen (NORCO/VICODIN) 5-325 MG per tablet Take 1-2 tablets by mouth every 4 (four) hours as needed for moderate pain. 03/11/15   Sherrie George, PA-C  HYDROcodone-acetaminophen (NORCO/VICODIN) 5-325 MG per tablet Take 1-2 tablets by mouth every 4 (four) hours as needed for moderate pain. 03/11/15   Darnell Level, MD  hydrOXYzine (ATARAX/VISTARIL) 25 MG tablet Take 1 tablet (25 mg) three times daily as needed: For anxiety Patient not taking: Reported on 03/10/2015 03/28/14   Armandina Stammer I, NP  ibuprofen (ADVIL) 200 MG tablet You can take 2-3 tablets every 6 hours as needed for pain. 03/11/15   Sherrie George, PA-C  Melatonin 5 MG TABS Take 1 tablet (5 mg total) by mouth at bedtime. For sleep 03/28/14   Armandina Stammer I, NP  Multiple  Vitamins-Minerals (MULTIVITAMIN & MINERAL PO) Take 1 tablet by mouth daily.    [provider]  naltrexone (DEPADE) 50 MG tablet Take 1 tablet (50 mg total) by mouth daily as needed (for drinking.). (Alcohol dependence) 03/28/14   Armandina Stammer I, NP  Omega-3 Fatty Acids (FISH OIL) 1000 MG CAPS Take 3,000 capsules by mouth daily.    [provider]  traZODone (DESYREL) 50 MG tablet Take 1 tablet (50 mg total) by mouth at bedtime and may repeat dose one time if needed. For sleep Patient not taking: Reported on 03/10/2015 03/28/14   Armandina Stammer I, NP  Vitamin D, Cholecalciferol, 1000 UNITS CAPS Take 1 capsule by mouth daily.    [provider]      Allergies    Patient has no known allergies.    Review of Systems   Review of Systems A 10 point review of systems was performed and is negative unless otherwise reported in HPI.  Physical Exam Updated Vital Signs BP 129/83 (BP Location: Right Arm)   Pulse 75   Temp 98.6 F (37 C)   Resp 18   Ht 5\' 10"  (1.778 m)   Wt 79.4 kg   SpO2 95%   BMI 25.11 kg/m  Physical Exam General: Normal appearing male, lying in bed.  HEENT: NCAT, Sclera anicteric, MMM, trachea midline.  Cardiology: RRR Resp: Normal respiratory rate and effort. Abd: Soft, non-distended. GU: Deferred. MSK:  Bilateral lower extremities with no peripheral edema or signs of trauma.  No deformities.  Tenderness to palpation to the gastrocnemius muscle, particularly the medial head.  No tenderness to palpation to the Achilles tendon.  With patient lying prone with knee flexed, Thompson test elicits plantarflexion.  Patient is able to plantarflex and dorsiflex the ankle though with discomfort in the calf. Skin: warm, dry.  Neuro: A&Ox4, CNs II-XII grossly intact. MAEs. Sensation grossly intact.  Psych: Normal mood and affect.   ED Results / Procedures / Treatments   Labs (all labs ordered are listed, but only abnormal results are displayed) Labs Reviewed -  No data to display  EKG None  Radiology No results found.  Procedures Procedures    EMERGENCY DEPARTMENT US SOFT TISSUE INTERPRETATION "Study: Limited Soft Tissue Ultrasound"  INDICATIONS: Pain Multiple views of the body part were obtained in real-time with a multi-frequency linear probe  PERFORMED BY: Myself IMAGES ARCHIVED?: No SIDE:Right  BODY PART:Lower extremity INTERPRETATION:   intact right achille's tendon    Medications Ordered in ED Medications  ketorolac (TORADOL) injection 30 mg (30 mg Intramuscular Given 07/26/23 1558)  acetaminophen (TYLENOL) tablet 1,000 mg (1,000 mg Oral Given 07/26/23 1557)    ED Course/ Medical Decision Making/ A&P                          Medical Decision Making Risk OTC drugs. Prescription drug management.    MDM:    Patient with pain in right calf after explosive plantarflexion of the right ankle, heard an audible pop.  Thompson test is negative and there is no tenderness palpation of the Achilles.  Bedside ultrasound of the Achilles tendon appears that it is intact.  History and exam consistent with a gastrocnemius tear.  He is offered crutches as well as a cam boot and instructed to use them for a few days, educated about the importance of early mobility, but patient states that he can get these items from his brother-in-law.  Instructed for RICE, alternating Tylenol and ibuprofen, and follow-up with orthopedic surgery in clinic.  Will be discharged with discharge instructions about gastrocnemius tear as well as rehab exercises and return precautions.  All questions answered to their satisfaction.    Additional history obtained from chart review, wife at bedside.    Reevaluation: After the interventions noted above, I reevaluated the patient and found that they have :stayed the same  Social Determinants of Health:  lives independently with wife and son  Disposition:  DC w/ discharge instructions/return precautions. All  questions answered to patient's satisfaction.    Co morbidities that complicate the patient evaluation  Past Medical History:  Diagnosis Date   Substance abuse (HCC)      Medicines Meds ordered this encounter  Medications   ketorolac (TORADOL) injection 30 mg   acetaminophen (TYLENOL) tablet 1,000 mg    I have reviewed the patients home medicines and have made adjustments as needed  Problem List / ED Course: Problem List Items Addressed This Visit   None Visit Diagnoses       Gastrocnemius tear, right, initial encounter    -  Primary                   This note was created using dictation software, which may contain spelling or grammatical errors.    Loetta Rough, MD 07/26/23 [phone]

## 2023-07-26 NOTE — ED Triage Notes (Signed)
Pt was playing soccer today and felt a pop and immediate pain to RT calf; can bear weight, but is very painful

## 2023-07-26 NOTE — Discharge Instructions (Addendum)
Thank you for coming to [facility] Emergency Department. You were seen for right calf pain that occurred while playing soccer. We did an exam and ultrasound which showed likely a gastrocnemius tear.   Rest, ice (four times per day for 20 minutes at a time), compression (with an ace wrap) and elevation can help treat and prevent swelling and pain. We have provided a walking boot and crutches to help for the firs few days but early mobility is very important as you can tolerate. Heel lifts in your shoes can also help relieve stress on the muscle. You can alternate taking Tylenol and ibuprofen as needed for pain. You can take 650mg  tylenol (acetaminophen) every 4-6 hours, and 600 mg ibuprofen 3 times a day. Please follow up with an orthopedic doctor, Dr. Roda Shutters, in clinic. You can call their office to schedule an appointment for the next 1-2 weeks.   Do not hesitate to return to the ED or call 911 if you experience: -Worsening symptoms -Lightheadedness, passing out -Fevers/chills -Anything else that concerns you

## 2023-07-26 NOTE — ED Notes (Signed)
Pt has a boot and crutches at home, so will not need from the ED.

## 2023-07-26 NOTE — ED Notes (Signed)
Discharge instructions reviewed with patient. Patient verbalizes understanding, no further questions at this time. Medications and follow up information provided. No acute distress noted at time of departure.  

## 2023-11-12 IMAGING — DX DG CHEST 1V
2 series · 2 of 2 positions shown · non-contrast
Comparison: None.

CLINICAL DATA: Left-sided chest pain

EXAM:
CHEST  1 VIEW

[chest ap (1 of 2)]
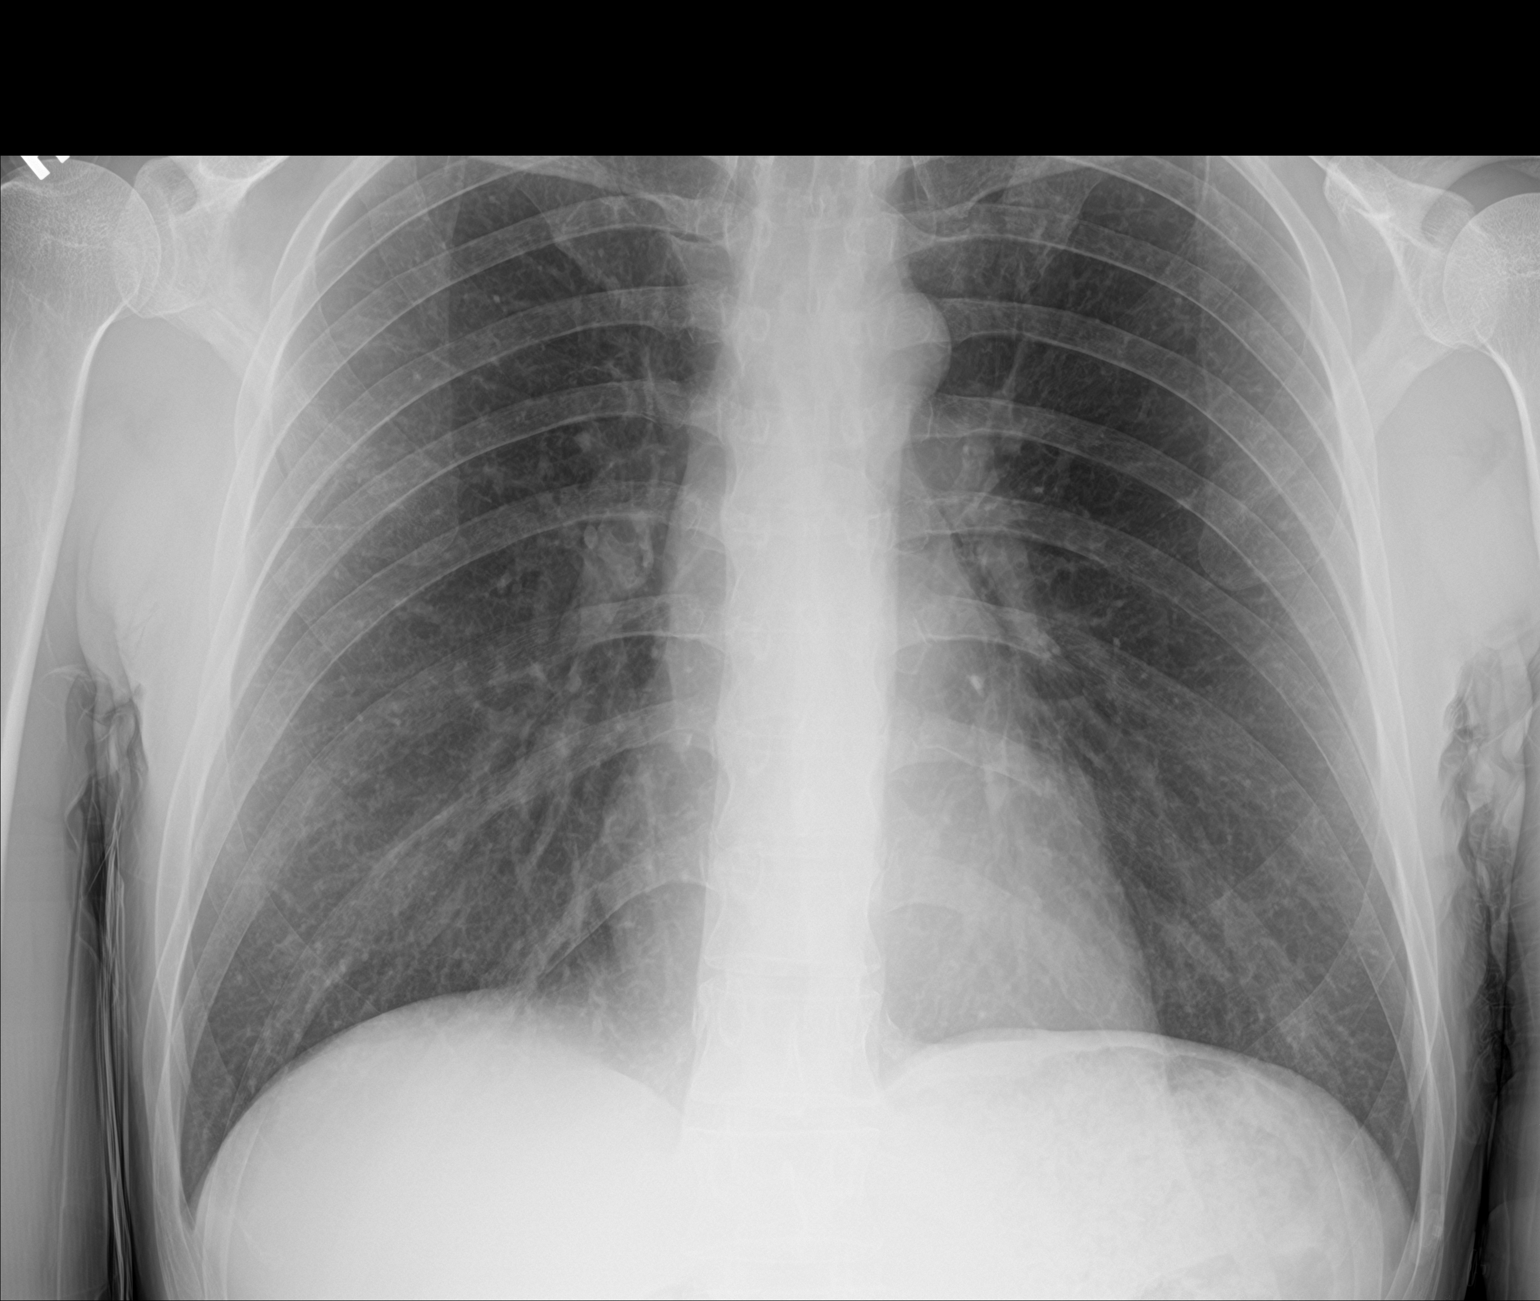

[chest ap (2 of 2)]
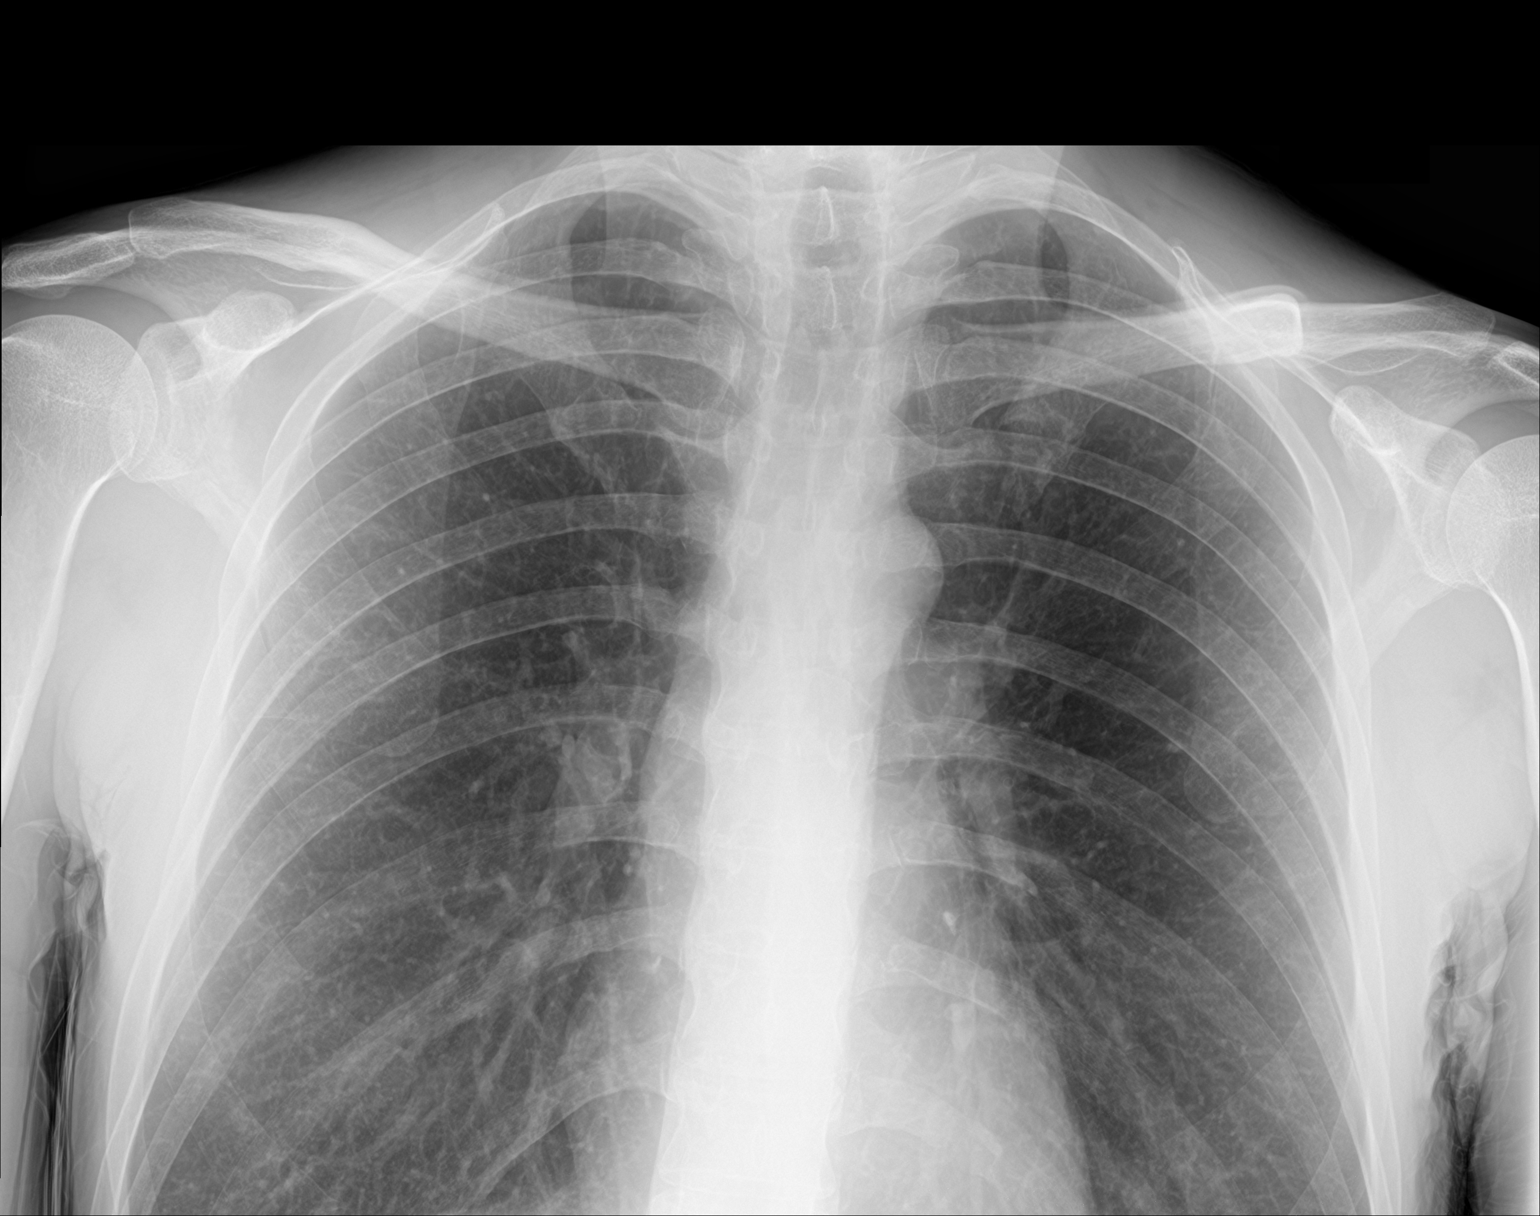

[2 of 2 positions shown; findings below may reference images not displayed]

FINDINGS: The cardiomediastinal silhouette is within normal limits. There is
no focal airspace consolidation. There is no pleural effusion. No
pneumothorax. No acute osseous abnormality. Possible old left mid
clavicle injury.
IMPRESSION: No evidence of acute cardiopulmonary disease.

## 2024-01-11 ENCOUNTER — Ambulatory Visit (INDEPENDENT_AMBULATORY_CARE_PROVIDER_SITE_OTHER)

## 2024-01-11 ENCOUNTER — Encounter: Payer: Self-pay | Admitting: Podiatry

## 2024-01-11 ENCOUNTER — Ambulatory Visit (INDEPENDENT_AMBULATORY_CARE_PROVIDER_SITE_OTHER): Payer: Self-pay | Admitting: Podiatry

## 2024-01-11 VITALS — Ht 70.0 in | Wt 175.0 lb

## 2024-01-11 DIAGNOSIS — S99922A Unspecified injury of left foot, initial encounter: Secondary | ICD-10-CM

## 2024-01-11 DIAGNOSIS — S92812A Other fracture of left foot, initial encounter for closed fracture: Secondary | ICD-10-CM

## 2024-01-11 NOTE — Progress Notes (Signed)
 Chief Complaint  Patient presents with   Foot Injury    Pt is here due to left foot injury states it happen 2 weeks ago, thought it was sprain walked on it for a few days before going to urgent care was told that they wasn't sure if it was broken and or fracture was given a post op shoe to wear and was told to stay off it as much as possible and keep it elevated.    HPI: 43 y.o. male presents today with left foot injury over the past 2 weeks.  He is unsure exactly what happened.  He is unsure of specific injury but noticed left forefoot pain after he thought what could be a sprain. He thinks he could have hyperextended the toe.  He reports bruising to the area.  States he recently was traveling for work and was walking quite a bit and was dealing with significant pain following this for some time.  He has been in a surgical shoe from urgent care.  Past Medical History:  Diagnosis Date   Substance abuse [facility])     Past Surgical History:  Procedure Laterality Date   LAPAROSCOPIC APPENDECTOMY N/A 03/10/2015   Procedure: APPENDECTOMY LAPAROSCOPIC;  Surgeon: Candyce Champagne, MD;  Location: [facility] ORS;  Service: General;  Laterality: N/A;   WRIST SURGERY      No Known Allergies  ROS    Physical Exam: There were no vitals filed for this visit.  General: The patient is alert and oriented x3 in no acute distress.  Dermatology: Skin is warm, dry and supple bilateral lower extremities. Interspaces are clear of maceration and debris.    Vascular: Palpable pedal pulses bilaterally. Capillary refill within normal limits.  No appreciable edema.  No erythema or calor.  Ecchymosis present left sub-forefoot underneath first metatarsal head, sesamoid complex  Neurological: Light touch sensation grossly intact bilateral feet.   Musculoskeletal Exam: Tenderness on palpation of left foot plantar sesamoid complex.  No significant pain with first MPJ range of motion.  Muscle strength 5/5 in dorsiflexion,  plantarflexion, inversion, eversion.  Ecchymosis present to left foot sesamoid complex  Radiographic Exam: Left foot 3 views weightbearing 01/11/2024 Normal osseous mineralization. Joint spaces preserved.  No fractures or osseous irregularities noted.  Questionable cortical irregularity of fibular sesamoid seen on AP view with area of irregularity seen transversely.  Questionable decreased density of tibial sesamoid on oblique.  Assessment/Plan of Care: 1. Closed fracture of sesamoid bone of left foot, initial encounter   2. Foot injury, left, initial encounter      No orders of the defined types were placed in this encounter.  CT FOOT LEFT WO CONTRAST  Discussed clinical findings with patient today.  Radiographs reviewed with patient  Concern for sesamoiditis with possible sesamoid stress fracture versus turf toe type injury. Placing patient in cam boot which was dispensed today, advised patient to remain nonweightbearing is much as possible.  He does have access to crutches/knee scooter.  CT scan ordered to further assess possible fracture.  Will have patient follow-up in approximately 4 weeks for now, will contact patient with CT scan results if treatment plan needs to be adjusted.  Advised patient to avoid regular NSAID use in the event of fracture. Vitamin D ordered.   Lonell Stamos L. Lunda Salines, AACFAS [facility]     [address]  [place], [place] [address]                Office [phone]  Fax [phone]

## 2024-01-12 LAB — VITAMIN D 25 HYDROXY (VIT D DEFICIENCY, FRACTURES): Vit D, 25-Hydroxy: 53.8 ng/mL (ref 30.0–100.0)

## 2024-01-17 ENCOUNTER — Telehealth: Payer: Self-pay

## 2024-01-17 NOTE — Telephone Encounter (Signed)
 I called trying to reach patient to let him know that he needs to call his insurance company to have his date of birth corrected. I am trying to do PA for MRI. Patient did not answer,but I left a message for patient to call the office so I can discuss with him.

## 2024-01-18 ENCOUNTER — Ambulatory Visit
Admission: RE | Admit: 2024-01-18 | Discharge: 2024-01-18 | Disposition: A | Discharge status: Home / Self Care | Source: Ambulatory Visit | Attending: Podiatry | Admitting: Podiatry

## 2024-01-18 DIAGNOSIS — S92812A Other fracture of left foot, initial encounter for closed fracture: Secondary | ICD-10-CM

## 2024-02-08 ENCOUNTER — Encounter: Payer: Self-pay | Admitting: Podiatry

## 2024-02-08 ENCOUNTER — Ambulatory Visit (INDEPENDENT_AMBULATORY_CARE_PROVIDER_SITE_OTHER): Payer: Self-pay | Admitting: Podiatry

## 2024-02-08 DIAGNOSIS — M25872 Other specified joint disorders, left ankle and foot: Secondary | ICD-10-CM | POA: Diagnosis not present

## 2024-02-08 NOTE — Progress Notes (Signed)
       Chief Complaint  Patient presents with   Fracture    Follow up sesamoid fracture left - patient states foot is feeling better, trying to rest more    HPI: 43 y.o. male presents today following up for left foot sesamoid pain.  CT scan was obtained which did not reveal any fracture.  He states that the foot is feeling a lot better and he has been using the cam boot, he does note some soreness whenever he comes out of it.  At this point he is 5 to 6 weeks out from initial hyperextension injury.  Past Medical History:  Diagnosis Date   Substance abuse [facility])     Past Surgical History:  Procedure Laterality Date   LAPAROSCOPIC APPENDECTOMY N/A 03/10/2015   Procedure: APPENDECTOMY LAPAROSCOPIC;  Surgeon: Elspeth Schultze, MD;  Location: [facility]S;  Service: General;  Laterality: N/A;   WRIST SURGERY      No Known Allergies  ROS    Physical Exam: There were no vitals filed for this visit.  General: The patient is alert and oriented x3 in no acute distress.  Dermatology: Skin is warm, dry and supple bilateral lower extremities. Interspaces are clear of maceration and debris.    Vascular: Palpable pedal pulses bilaterally. Capillary refill within normal limits.  No appreciable edema.  No erythema or calor.  Ecchymosis resolved at the sesamoid complex.  Neurological: Light touch sensation grossly intact bilateral feet.   Musculoskeletal Exam: Mild tenderness on palpation of left foot plantar sesamoid complex.  No significant pain with first MPJ range of motion.  Muscle strength 5/5 in dorsiflexion, plantarflexion, inversion, eversion.    Radiographic Exam:  CT scan left foot without contrast 01/18/2024 IMPRESSION: 1. Negative for fracture. Specifically, the sesamoid bones are intact. 2. Small calcifications in the plantar soft tissues along the distal first metatarsal could be due to some prior infectious or inflammatory process or chondrocalcinosis.     Electronically Signed    By: Debby Prader M.D.   On: 01/31/2024 09:16  Assessment/Plan of Care: 1. Sesamoiditis of left foot       No orders of the defined types were placed in this encounter.  None  Discussed clinical findings with patient today.  Reviewed CT scan with patient.  Negative for acute sesamoid fracture.  He has been immobilized in cam boot for 4 weeks at this point.  Transition to good supportive shoe with use of dancers pad.  He does have meloxicam prescription, discussed that this will be beneficial for his pain. Utilize RICE protocol.  Gradual return to activity.  If pain continues to be ongoing, could consider MRI for possible turf toe injury.  Follow-up in 3 to 4 weeks for reevaluation if still having symptoms.   Quanisha Drewry L. Lamount MAUL, AACFAS [facility]     [address] [place], [place] [address]                Office [phone]  Fax [phone]

## 2024-02-11 ENCOUNTER — Encounter: Payer: Self-pay | Admitting: Podiatry

## 2024-02-13 ENCOUNTER — Emergency Department (HOSPITAL_BASED_OUTPATIENT_CLINIC_OR_DEPARTMENT_OTHER)
Admission: EM | Admit: 2024-02-13 | Discharge: 2024-02-14 | Disposition: A | Discharge status: Home / Self Care | Attending: Emergency Medicine | Admitting: Emergency Medicine

## 2024-02-13 ENCOUNTER — Encounter (HOSPITAL_BASED_OUTPATIENT_CLINIC_OR_DEPARTMENT_OTHER): Payer: Self-pay | Admitting: Radiology

## 2024-02-13 ENCOUNTER — Other Ambulatory Visit: Payer: Self-pay

## 2024-02-13 DIAGNOSIS — T40711A Poisoning by cannabis, accidental (unintentional), initial encounter: Secondary | ICD-10-CM | POA: Insufficient documentation

## 2024-02-13 DIAGNOSIS — R7309 Other abnormal glucose: Secondary | ICD-10-CM | POA: Insufficient documentation

## 2024-02-13 DIAGNOSIS — R4182 Altered mental status, unspecified: Secondary | ICD-10-CM

## 2024-02-13 DIAGNOSIS — F1290 Cannabis use, unspecified, uncomplicated: Secondary | ICD-10-CM

## 2024-02-13 LAB — CBC WITH DIFFERENTIAL/PLATELET
Abs Immature Granulocytes: 0.04 K/uL (ref 0.00–0.07)
Basophils Absolute: 0 K/uL (ref 0.0–0.1)
Basophils Relative: 1 %
Eosinophils Absolute: 0 K/uL (ref 0.0–0.5)
Eosinophils Relative: 0 %
HCT: 35.5 % — ABNORMAL LOW (ref 39.0–52.0)
Hemoglobin: 12.3 g/dL — ABNORMAL LOW (ref 13.0–17.0)
Immature Granulocytes: 1 %
Lymphocytes Relative: 32 %
Lymphs Abs: 2.6 K/uL (ref 0.7–4.0)
MCH: 33 pg (ref 26.0–34.0)
MCHC: 34.6 g/dL (ref 30.0–36.0)
MCV: 95.2 fL (ref 80.0–100.0)
Monocytes Absolute: 0.6 K/uL (ref 0.1–1.0)
Monocytes Relative: 7 %
Neutro Abs: 4.9 K/uL (ref 1.7–7.7)
Neutrophils Relative %: 59 %
Platelets: 207 K/uL (ref 150–400)
RBC: 3.73 MIL/uL — ABNORMAL LOW (ref 4.22–5.81)
RDW: 12.9 % (ref 11.5–15.5)
WBC: 8.1 K/uL (ref 4.0–10.5)
nRBC: 0 % (ref 0.0–0.2)

## 2024-02-13 LAB — COMPREHENSIVE METABOLIC PANEL WITH GFR
ALT: 14 U/L (ref 0–44)
AST: 20 U/L (ref 15–41)
Albumin: 4 g/dL (ref 3.5–5.0)
Alkaline Phosphatase: 43 U/L (ref 38–126)
Anion gap: 15 (ref 5–15)
BUN: 14 mg/dL (ref 6–20)
CO2: 24 mmol/L (ref 22–32)
Calcium: 8.6 mg/dL — ABNORMAL LOW (ref 8.9–10.3)
Chloride: 101 mmol/L (ref 98–111)
Creatinine, Ser: 0.71 mg/dL (ref 0.61–1.24)
GFR, Estimated: >60 mL/min (ref >60)
Glucose, Bld: 126 mg/dL — ABNORMAL HIGH (ref 70–99)
Potassium: 3.3 mmol/L — ABNORMAL LOW (ref 3.5–5.1)
Sodium: 140 mmol/L (ref 135–145)
Total Bilirubin: 0.2 mg/dL (ref 0.0–1.2)
Total Protein: 6.6 g/dL (ref 6.5–8.1)

## 2024-02-13 LAB — ACETAMINOPHEN LEVEL: Acetaminophen (Tylenol), Serum: <10 ug/mL — ABNORMAL LOW (ref 10–30)

## 2024-02-13 LAB — ETHANOL: Alcohol, Ethyl (B): <15 mg/dL (ref <15)

## 2024-02-13 LAB — LIPASE, BLOOD: Lipase: 21 U/L (ref 11–51)

## 2024-02-13 LAB — SALICYLATE LEVEL: Salicylate Lvl: <7 mg/dL — ABNORMAL LOW (ref 7.0–30.0)

## 2024-02-13 MED ORDER — LORAZEPAM 2 MG/ML IJ SOLN
INTRAMUSCULAR | Status: AC
  Filled 2024-02-13: qty 1

## 2024-02-13 MED ORDER — SODIUM CHLORIDE 0.9 % IV BOLUS
1000.0000 mL | Freq: Once | INTRAVENOUS | Status: AC
  Administered 2024-02-13: 1000 mL via INTRAVENOUS

## 2024-02-13 MED ORDER — LORAZEPAM 2 MG/ML IJ SOLN
1.0000 mg | Freq: Once | INTRAMUSCULAR | Status: AC
Start: 2024-02-13 — End: 2024-02-13
  Administered 2024-02-13: 1 mg via INTRAVENOUS

## 2024-02-13 NOTE — ED Provider Notes (Signed)
 [place] [facility] Provider Note   CSN: [id number] Arrival date & time: 02/13/24  2132     Patient presents with: Ingestion   Vincent Townsend is a 43 y.o. male.   Patient is a 43 year old male presenting for ingestion.  Patient states that he tried a new THC gummy that had delta 9 and it.  His mother-in-law states he also drink a bottle of wine.  Patient states that approximately 45 minutes after ingesting the gummy he began having difficulty speaking, jerking-like motions, racing heart rate, and dry mouth.  The history is provided by the patient. No language interpreter was used.  Ingestion Pertinent negatives include no chest pain, no abdominal pain and no shortness of breath.       Prior to Admission medications   Medication Sig Start Date End Date Taking? Authorizing Provider  naltrexone  (DEPADE) 50 MG tablet Take 1 tablet (50 mg total) by mouth daily as needed (for drinking.). (Alcohol dependence) 03/28/14   Collene Gouge I, NP  Omega-3 Fatty Acids (FISH OIL) 1000 MG CAPS Take 3,000 capsules by mouth daily.    [provider]  Vitamin D , Cholecalciferol, 1000 UNITS CAPS Take 1 capsule by mouth daily.    [provider]    Allergies: Patient has no known allergies.    Review of Systems  Constitutional:  Negative for chills and fever.  HENT:  Negative for ear pain and sore throat.   Eyes:  Negative for pain and visual disturbance.  Respiratory:  Negative for cough and shortness of breath.   Cardiovascular:  Negative for chest pain and palpitations.  Gastrointestinal:  Negative for abdominal pain and vomiting.  Genitourinary:  Negative for dysuria and hematuria.  Musculoskeletal:  Negative for arthralgias and back pain.  Skin:  Negative for color change and rash.  Neurological:  Negative for seizures and syncope.  All other systems reviewed and are negative.   Updated Vital Signs BP 123/64 (BP Location: Left Arm)    Pulse (!) 108   Temp (!) 97.5 F (36.4 C)   Resp (!) 25   Ht 5' 10 (1.778 m)   Wt 79.4 kg   SpO2 98%   BMI 25.11 kg/m   Physical Exam Vitals and nursing note reviewed.  Constitutional:      General: He is not in acute distress.    Appearance: He is well-developed.  HENT:     Head: Normocephalic and atraumatic.  Eyes:     Conjunctiva/sclera: Conjunctivae normal.  Cardiovascular:     Rate and Rhythm: Regular rhythm. Tachycardia present.     Heart sounds: No murmur heard. Pulmonary:     Effort: Pulmonary effort is normal. No respiratory distress.     Breath sounds: Normal breath sounds.  Abdominal:     Palpations: Abdomen is soft.     Tenderness: There is no abdominal tenderness.  Musculoskeletal:        General: No swelling.     Cervical back: Neck supple.  Skin:    General: Skin is warm and dry.     Capillary Refill: Capillary refill takes less than 2 seconds.  Neurological:     Mental Status: He is alert and oriented to person, place, and time.     GCS: GCS eye subscore is 4. GCS verbal subscore is 5. GCS motor subscore is 6.     Comments: Patient is alert and oriented x 3, following commands, moving all 4 extremities without difficulty, sensation and motor  function intact.  He does have his arms pulled to his chest in a retracted like posture.  He is marching his feet in bed while laying down.  He is demonstrating a chewing like motion with his mouth.  Psychiatric:        Mood and Affect: Mood normal.     (all labs ordered are listed, but only abnormal results are displayed) Labs Reviewed  SALICYLATE LEVEL - Abnormal; Notable for the following components:      Result Value   Salicylate Lvl <7.0 (*)    All other components within normal limits  ACETAMINOPHEN  LEVEL - Abnormal; Notable for the following components:   Acetaminophen  (Tylenol ), Serum <10 (*)    All other components within normal limits  CBC WITH DIFFERENTIAL/PLATELET - Abnormal; Notable for the  following components:   RBC 3.73 (*)    Hemoglobin 12.3 (*)    HCT 35.5 (*)    All other components within normal limits  COMPREHENSIVE METABOLIC PANEL WITH GFR - Abnormal; Notable for the following components:   Potassium 3.3 (*)    Glucose, Bld 126 (*)    Calcium 8.6 (*)    All other components within normal limits  ETHANOL  LIPASE, BLOOD  URINE DRUG SCREEN    EKG: None  Radiology: No results found.   Procedures   Medications Ordered in the ED  LORazepam  (ATIVAN ) 2 MG/ML injection (has no administration in time range)  LORazepam  (ATIVAN ) injection 1 mg (1 mg Intravenous Given 02/13/24 2219)  sodium chloride  0.9 % bolus 1,000 mL (0 mLs Intravenous Stopped 02/13/24 2234)                                    Medical Decision Making Amount and/or Complexity of Data Reviewed Labs: ordered.  Risk Prescription drug management.   43 year old male presenting for dry mouth, heart rate, jerking-like motions, and difficulty speaking after eating a THC gummy with delta 9 in it and drinking a bottle of wine.  On physical exam he is alert and oriented x 3, tachycardic, tachypneic, performing a chewing like motion with his mouth, marching his legs in the bed, and keeps his arms retracted up to his chest.  Twelve-lead EKG demonstrates sinus tachycardia with a rate of 129 bpm.  QTc 479.  Patient is tachypneic with respiratory rate of 22.  IV fluids and Ativan  initiated.  UDS, ethanol, salicylate, acetaminophen , basic labs ordered and pending.  Acetaminophen  less than 10.  Salicylate less than 7.  POC glucose 126.  Alcohol less than 15.  UDS pending.  On reevaluation patient's twitching and jerking like motions has improved.  His heart rate has improved to 108 bpm.  His respirations are at 17.  Patient signed out to oncoming provider while awaiting patient to be clinically sober enough to discharge.  No acute life-threatening etiologies for symptoms outside of THC toxicity.     Final  diagnoses:  None    ED Discharge Orders     None          Elnor Bernarda SQUIBB, DO 02/13/24 2322

## 2024-02-13 NOTE — ED Triage Notes (Incomplete)
 Pt states about 45 minutes ago he took a small part of a THC gummy. Pt states he is now very thirsty and can't stop jerking. Pt states its hard to use his words and is very slurred with his words.

## 2024-02-13 NOTE — ED Notes (Signed)
MD in room to assess patient.   

## 2024-02-14 NOTE — ED Provider Notes (Signed)
  Physical Exam  BP (!) 111/52   Pulse 85   Temp (!) 97.5 F (36.4 C)   Resp 14   Ht 5' 10 (1.778 m)   Wt 79.4 kg   SpO2 91%   BMI 25.11 kg/m   Physical Exam Vitals and nursing note reviewed.  HENT:     Head: Normocephalic.  Pulmonary:     Effort: Pulmonary effort is normal.  Neurological:     Mental Status: He is alert and oriented to person, place, and time.     Procedures  Procedures  ED Course / MDM    Medical Decision Making Amount and/or Complexity of Data Reviewed Labs: ordered.  Risk Prescription drug management.   Care assumed from Dr. Elnor at shift change.  Patient presenting with altered mental status and agitation after eating a delta 8 gummy.  Laboratory studies all unremarkable.  He has been observed here for many hours and is now approaching his baseline.  His mother-in-law is here to pick him up and take him home.  Patient to be discharged in the care of family and is to return as needed.       Geroldine Berg, MD 02/14/24 0200

## 2024-02-14 NOTE — Discharge Instructions (Signed)
 Refrain from further of cannabis edibles.  Return to the ER for any new and/or concerning issues.

## 2024-03-07 ENCOUNTER — Ambulatory Visit: Admitting: Podiatry
